# Patient Record
Sex: Female | Born: 1965 | Race: Black or African American | Hispanic: No | Smoking: Never smoker
Health system: Southern US, Community
[De-identification: ages and names within clinical notes are randomized; demographics above are authoritative.]

## PROBLEM LIST (undated history)

## (undated) DIAGNOSIS — K219 Gastro-esophageal reflux disease without esophagitis: Secondary | ICD-10-CM

## (undated) DIAGNOSIS — R011 Cardiac murmur, unspecified: Secondary | ICD-10-CM

## (undated) DIAGNOSIS — G4730 Sleep apnea, unspecified: Secondary | ICD-10-CM

## (undated) DIAGNOSIS — H269 Unspecified cataract: Secondary | ICD-10-CM

## (undated) DIAGNOSIS — D649 Anemia, unspecified: Secondary | ICD-10-CM

## (undated) HISTORY — DX: Unspecified cataract: H26.9

## (undated) HISTORY — PX: RETINAL DETACHMENT SURGERY: SHX105

## (undated) HISTORY — DX: Gastro-esophageal reflux disease without esophagitis: K21.9

## (undated) HISTORY — DX: Anemia, unspecified: D64.9

## (undated) HISTORY — DX: Cardiac murmur, unspecified: R01.1

---

## 2005-03-27 ENCOUNTER — Ambulatory Visit: Payer: Self-pay

## 2006-03-05 ENCOUNTER — Ambulatory Visit: Payer: Self-pay

## 2006-10-29 ENCOUNTER — Emergency Department: Payer: Self-pay | Admitting: Unknown Physician Specialty

## 2007-03-25 ENCOUNTER — Ambulatory Visit: Payer: Self-pay | Admitting: Family Medicine

## 2008-05-14 HISTORY — PX: EYE SURGERY: SHX253

## 2008-12-29 ENCOUNTER — Ambulatory Visit: Payer: Self-pay | Admitting: Family Medicine

## 2009-07-29 LAB — HM DEXA SCAN: HM DEXA SCAN: NORMAL

## 2009-09-08 ENCOUNTER — Emergency Department: Payer: Self-pay | Admitting: Emergency Medicine

## 2009-09-19 ENCOUNTER — Ambulatory Visit: Payer: Self-pay | Admitting: Family Medicine

## 2010-01-04 ENCOUNTER — Ambulatory Visit: Payer: Self-pay | Admitting: Family Medicine

## 2011-01-18 ENCOUNTER — Ambulatory Visit: Payer: Self-pay | Admitting: Family Medicine

## 2012-02-27 ENCOUNTER — Ambulatory Visit: Payer: Self-pay

## 2012-07-07 ENCOUNTER — Emergency Department: Payer: Self-pay | Admitting: Emergency Medicine

## 2013-05-18 ENCOUNTER — Ambulatory Visit: Payer: Self-pay

## 2014-03-12 LAB — HM PAP SMEAR: HM Pap smear: NORMAL

## 2014-05-03 LAB — HM MAMMOGRAPHY: HM MAMMO: NORMAL

## 2014-07-21 ENCOUNTER — Ambulatory Visit: Payer: Self-pay

## 2014-07-26 LAB — LIPID PANEL
Cholesterol: 138 mg/dL (ref 0–200)
HDL: 34 mg/dL — AB (ref 35–70)
LDL CALC: 90 mg/dL
Triglycerides: 72 mg/dL (ref 40–160)

## 2014-07-26 LAB — BASIC METABOLIC PANEL: GLUCOSE: 120 mg/dL

## 2014-09-20 LAB — HEMOGLOBIN A1C: Hemoglobin A1C: 6.8

## 2015-01-21 ENCOUNTER — Other Ambulatory Visit: Payer: Self-pay | Admitting: Family Medicine

## 2015-01-21 NOTE — Telephone Encounter (Signed)
Patient requesting refill. 

## 2015-05-10 ENCOUNTER — Other Ambulatory Visit: Payer: Self-pay | Admitting: Family Medicine

## 2015-05-10 ENCOUNTER — Ambulatory Visit (INDEPENDENT_AMBULATORY_CARE_PROVIDER_SITE_OTHER): Payer: Self-pay | Admitting: Family Medicine

## 2015-05-10 ENCOUNTER — Encounter: Payer: Self-pay | Admitting: Family Medicine

## 2015-05-10 VITALS — BP 126/68 | HR 89 | Temp 98.6°F | Resp 18 | Ht 63.0 in | Wt 201.2 lb

## 2015-05-10 DIAGNOSIS — Z01419 Encounter for gynecological examination (general) (routine) without abnormal findings: Secondary | ICD-10-CM

## 2015-05-10 DIAGNOSIS — Z124 Encounter for screening for malignant neoplasm of cervix: Secondary | ICD-10-CM

## 2015-05-10 DIAGNOSIS — Z113 Encounter for screening for infections with a predominantly sexual mode of transmission: Secondary | ICD-10-CM

## 2015-05-10 DIAGNOSIS — Z23 Encounter for immunization: Secondary | ICD-10-CM

## 2015-05-10 DIAGNOSIS — Z1239 Encounter for other screening for malignant neoplasm of breast: Secondary | ICD-10-CM

## 2015-05-10 DIAGNOSIS — K219 Gastro-esophageal reflux disease without esophagitis: Secondary | ICD-10-CM | POA: Insufficient documentation

## 2015-05-10 DIAGNOSIS — Z3040 Encounter for surveillance of contraceptives, unspecified: Secondary | ICD-10-CM

## 2015-05-10 DIAGNOSIS — Z0000 Encounter for general adult medical examination without abnormal findings: Secondary | ICD-10-CM

## 2015-05-10 MED ORDER — ETONOGESTREL-ETHINYL ESTRADIOL 0.12-0.015 MG/24HR VA RING: VAGINAL_RING | VAGINAL | Status: DC

## 2015-05-10 MED ORDER — RANITIDINE HCL 300 MG PO TABS: 300.0000 mg | ORAL_TABLET | Freq: Every day | ORAL | Status: DC

## 2015-05-10 NOTE — Progress Notes (Signed)
Name: Nancy Mcdowell   MRN: [id number]    DOB: 1965-06-23   Date:05/10/2015       Progress Note  Subjective  Chief Complaint  Chief Complaint  Patient presents with  . Annual Exam    HPI  Well Woman: she has family planning medicaid and is unable to afford any extra labs for screening. She had intercourse 2 days ago and used condoms and also uses Nuvaring. LMP: 04/26/2015, cycles are regular. No  Vaginal discharge, no dyspareunia.   Patient Active Problem List   Diagnosis Date Noted  . GERD (gastroesophageal reflux disease) 05/10/2015    History reviewed. No pertinent past surgical history.  Family History  Problem Relation Age of Onset  . Hyperlipidemia Mother   . Hypertension Mother   . Diabetes Mother   . Kidney disease Father     Social History   Social History  . Marital Status: Married    Spouse Name: N/A  . Number of Children: N/A  . Years of Education: N/A   Occupational History  . Not on file.   Social History Main Topics  . Smoking status: Never Smoker   . Smokeless tobacco: Never Used  . Alcohol Use: 0.6 oz/week    0 Standard drinks or equivalent, 1 Glasses of wine per week  . Drug Use: No  . Sexual Activity: Yes   Other Topics Concern  . Not on file   Social History Narrative  . No narrative on file     Current outpatient prescriptions:  .  etonogestrel-ethinyl estradiol (NUVARING) 0.12-0.015 MG/24HR vaginal ring, , Disp: , Rfl:  .  ranitidine (ZANTAC) 300 MG tablet, take 1 tablet by mouth once daily, Disp: 30 tablet, Rfl: 2  No Known Allergies   ROS  Constitutional: Negative for fever or weight change.  Respiratory: Negative for cough and shortness of breath.   Cardiovascular: Negative for chest pain or palpitations.  Gastrointestinal: Negative for abdominal pain, no bowel changes.  Musculoskeletal: Negative for gait problem or joint swelling.  Skin: Negative for rash.  Neurological: Negative for dizziness or headache.  No other  specific complaints in a complete review of systems (except as listed in HPI above).  Objective  Filed Vitals:   05/10/15 0931  BP: 126/68  Pulse: 89  Temp: 98.6 F (37 C)  TempSrc: Oral  Resp: 18  Height: 5\' 3"  (1.6 m)  Weight: 201 lb 3.2 oz (91.264 kg)  SpO2: 98%    Body mass index is 35.65 kg/(m^2).  Physical Exam   Constitutional: Patient appears well-developed and well-nourished. No distress.  HENT: Head: Normocephalic and atraumatic. Ears: B TMs ok, no erythema or effusion; Nose: Nose normal. Mouth/Throat: Oropharynx is clear and moist. No oropharyngeal exudate.  Eyes: Conjunctivae and EOM are normal. Pupils are equal, round, and reactive to light. No scleral icterus.  Neck: Normal range of motion. Neck supple. No JVD present. No thyromegaly present.  Cardiovascular: Normal rate, regular rhythm and normal heart sounds.  No murmur heard. No BLE edema. Pulmonary/Chest: Effort normal and breath sounds normal. No respiratory distress. Abdominal: Soft. Bowel sounds are normal, no distension. There is no tenderness. no masses Breast:lumpy breast, no nipple discharge or rashes, breast tissue on both axillas FEMALE GENITALIA:  External genitalia normal External urethra normal Vaginal vault normal without discharge or lesions Cervix normal without discharge or lesions Bimanual exam normal without masses RECTAL: not done Musculoskeletal: Normal range of motion, no joint effusions. No gross deformities Neurological: he is alert and  oriented to person, place, and time. No cranial nerve deficit. Coordination, balance, strength, speech and gait are normal.  Skin: Skin is warm and dry. No rash noted. No erythema.  Psychiatric: Patient has a normal mood and affect. behavior is normal. Judgment and thought content normal.   PHQ2/9: Depression screen PHQ 2/9 05/10/2015  Decreased Interest 0  Down, Depressed, Hopeless 0  PHQ - 2 Score 0    Fall Risk: Fall Risk  05/10/2015  Falls  in the past year? No    Functional Status Survey: Is the patient deaf or have difficulty hearing?: No Does the patient have difficulty seeing, even when wearing glasses/contacts?: Yes (conjtacts/glasses) Does the patient have difficulty concentrating, remembering, or making decisions?: No Does the patient have difficulty walking or climbing stairs?: No Does the patient have difficulty dressing or bathing?: No Does the patient have difficulty doing errands alone such as visiting a doctor's office or shopping?: No   Assessment & Plan  1. Well woman exam  Discussed importance of 150 minutes of physical activity weekly, eat two servings of fish weekly, eat one serving of tree nuts ( cashews, pistachios, pecans, almonds.Marland Kitchen) every other day, eat 6 servings of fruit/vegetables daily and drink plenty of water and avoid sweet beverages.   2. Needs flu shot  - Flu Vaccine QUAD 36+ mos PF IM (Fluarix & Fluzone Quad PF) -refused  3. Cervical cancer screening  - Pap IG, CT/NG NAA, and HPV (high risk)  4. Routine screening for STI (sexually transmitted infection)  - Pap IG, CT/NG NAA, and HPV (high risk) - HIV antibody - RPR  5. Encounter for surveillance of contraceptives  - etonogestrel-ethinyl estradiol (NUVARING) 0.12-0.015 MG/24HR vaginal ring; Insert monthly  Dispense: 1 each; Refill: 12   6. Breast cancer screening  - MM Digital Screening; Future

## 2015-05-11 LAB — HIV ANTIBODY (ROUTINE TESTING W REFLEX): HIV SCREEN 4TH GENERATION: NONREACTIVE

## 2015-05-11 LAB — RPR: RPR: NONREACTIVE

## 2015-05-13 LAB — PAP IG, CT-NG NAA, HPV HIGH-RISK
HPV, HIGH-RISK: NEGATIVE
PAP SMEAR COMMENT: 0

## 2016-05-22 ENCOUNTER — Ambulatory Visit (INDEPENDENT_AMBULATORY_CARE_PROVIDER_SITE_OTHER): Payer: Medicaid Other | Admitting: Family Medicine

## 2016-05-22 ENCOUNTER — Encounter: Payer: Self-pay | Admitting: Family Medicine

## 2016-05-22 VITALS — BP 116/64 | HR 106 | Temp 98.7°F | Resp 18 | Ht 63.0 in | Wt 199.1 lb

## 2016-05-22 DIAGNOSIS — Z23 Encounter for immunization: Secondary | ICD-10-CM

## 2016-05-22 DIAGNOSIS — Z124 Encounter for screening for malignant neoplasm of cervix: Secondary | ICD-10-CM

## 2016-05-22 DIAGNOSIS — Z3040 Encounter for surveillance of contraceptives, unspecified: Secondary | ICD-10-CM | POA: Diagnosis not present

## 2016-05-22 DIAGNOSIS — Z113 Encounter for screening for infections with a predominantly sexual mode of transmission: Secondary | ICD-10-CM

## 2016-05-22 DIAGNOSIS — Z01419 Encounter for gynecological examination (general) (routine) without abnormal findings: Secondary | ICD-10-CM

## 2016-05-22 DIAGNOSIS — Z0000 Encounter for general adult medical examination without abnormal findings: Secondary | ICD-10-CM

## 2016-05-22 DIAGNOSIS — L83 Acanthosis nigricans: Secondary | ICD-10-CM | POA: Insufficient documentation

## 2016-05-22 DIAGNOSIS — Z1231 Encounter for screening mammogram for malignant neoplasm of breast: Secondary | ICD-10-CM | POA: Diagnosis not present

## 2016-05-22 DIAGNOSIS — Z79899 Other long term (current) drug therapy: Secondary | ICD-10-CM

## 2016-05-22 DIAGNOSIS — Z1239 Encounter for other screening for malignant neoplasm of breast: Secondary | ICD-10-CM

## 2016-05-22 NOTE — Progress Notes (Signed)
Name: Nancy Mcdowell   MRN: [id number]    DOB: Mar 02, 1966   Date:05/22/2016       Progress Note  Subjective  Chief Complaint  Chief Complaint  Patient presents with  . Annual Exam    HPI  Well Woman: patient has been sexually active with a new partner, no dyspareunia, she has lumpy breast, discussed mammogram . She is concerned about pinworm, because she has some itching on her bottom at night - going on for years ( she only has family planning medicaid - advised to do a otc test to check for pinworm  Patient Active Problem List   Diagnosis Date Noted  . GERD (gastroesophageal reflux disease) 05/10/2015    No past surgical history on file.  Family History  Problem Relation Age of Onset  . Hyperlipidemia Mother   . Hypertension Mother   . Diabetes Mother   . Kidney disease Father     Social History   Social History  . Marital status: Married    Spouse name: N/A  . Number of children: N/A  . Years of education: N/A   Occupational History  . Not on file.   Social History Main Topics  . Smoking status: Never Smoker  . Smokeless tobacco: Never Used  . Alcohol use 0.6 oz/week    1 Glasses of wine per week  . Drug use: No  . Sexual activity: Yes    Partners: Male   Other Topics Concern  . Not on file   Social History Narrative  . No narrative on file     Current Outpatient Prescriptions:  .  NUVARING 0.12-0.015 MG/24HR vaginal ring, insert 1 ring vaginally for 3 weeks REMOVE for 1 week and repeat, Disp: 1 each, Rfl: 12 .  ranitidine (ZANTAC) 300 MG tablet, Take 1 tablet (300 mg total) by mouth daily., Disp: 30 tablet, Rfl: 5  No Known Allergies   ROS  Constitutional: Negative for fever or weight change.  Respiratory: Negative for cough and shortness of breath.   Cardiovascular: Negative for chest pain or palpitations.  Gastrointestinal: Negative for abdominal pain, no bowel changes.  Musculoskeletal: Negative for gait problem or joint swelling.  Skin:  Negative for rash.  Neurological: Negative for dizziness or headache. She snores and gets sleepy during the day ( but refuses sleep study ) No other specific complaints in a complete review of systems (except as listed in HPI above).  Objective  Vitals:   05/22/16 1404  BP: 116/64  Pulse: (!) 106  Resp: 18  Temp: 98.7 F (37.1 C)  TempSrc: Oral  SpO2: 97%  Weight: 199 lb 1.6 oz (90.3 kg)  Height: 5\' 3"  (1.6 m)    Body mass index is 35.27 kg/m.  Physical Exam  Constitutional: Patient appears well-developed and obese No distress.  HENT: Head: Normocephalic and atraumatic. Ears: B TMs ok, no erythema or effusion; Nose: Nose normal. Mouth/Throat: Oropharynx is clear and moist. No oropharyngeal exudate.  Eyes: Conjunctivae and EOM are normal. Pupils are equal, round, and reactive to light. No scleral icterus.  Neck: Normal range of motion. Neck supple. No JVD present. No thyromegaly present.  Cardiovascular: Normal rate, regular rhythm and normal heart sounds.  No murmur heard. No BLE edema. Pulmonary/Chest: Effort normal and breath sounds normal. No respiratory distress. Abdominal: Soft. Bowel sounds are normal, no distension. There is no tenderness. no masses Breast:lumpy breast but no masses, no nipple discharge or rashes, breast tissue on both axilla, left worse than right  side FEMALE GENITALIA:  External genitalia normal External urethra normal Vaginal vault normal without discharge or lesions Cervix normal without discharge or lesions Bimanual exam normal without masses RECTAL: not done Musculoskeletal: Normal range of motion, no joint effusions. No gross deformities Neurological: he is alert and oriented to person, place, and time. No cranial nerve deficit. Coordination, balance, strength, speech and gait are normal.  Skin: Skin is warm and dry. Acanthosis nigricans neck . No erythema.  Psychiatric: Patient has a normal mood and affect. behavior is normal. Judgment and  thought content normal.  PHQ2/9: Depression screen [facility] 2/9 05/22/2016 05/10/2015  Decreased Interest 0 0  Down, Depressed, Hopeless 0 0  PHQ - 2 Score 0 0    Fall Risk: Fall Risk  05/22/2016 05/10/2015  Falls in the past year? No No    Functional Status Survey: Is the patient deaf or have difficulty hearing?: No Does the patient have difficulty seeing, even when wearing glasses/contacts?: No Does the patient have difficulty concentrating, remembering, or making decisions?: No Does the patient have difficulty walking or climbing stairs?: No Does the patient have difficulty dressing or bathing?: No Does the patient have difficulty doing errands alone such as visiting a doctor's office or shopping?: No    Assessment & Plan  1. Well woman exam  Discussed importance of 150 minutes of physical activity weekly, eat two servings of fish weekly, eat one serving of tree nuts ( cashews, pistachios, pecans, almonds.Marland Kitchen) every other day, eat 6 servings of fruit/vegetables daily and drink plenty of water and avoid sweet beverages.  - Hemoglobin A1c - Lipid panel  2. Needs flu shot  refused  3. Cervical cancer screening  - Pap IG, CT/NG NAA, and HPV (high risk)  4. Routine screening for STI (sexually transmitted infection)  -HIV  -RPR genprobe  5. Breast cancer screening  - MM Digital Screening; Future  6. Encounter for surveillance of contraceptives, unspecified contraceptive  - HIV antibody - RPR  7. Long-term use of high-risk medication  - COMPLETE METABOLIC PANEL WITH GFR

## 2016-05-22 NOTE — Addendum Note (Signed)
Addended by: Inda Coke on: 05/22/2016 03:18 PM   Modules accepted: Orders

## 2016-05-26 LAB — PAP IG, CT-NG NAA, HPV HIGH-RISK
Chlamydia, Nuc. Acid Amp: NEGATIVE
GONOCOCCUS BY NUCLEIC ACID AMP: NEGATIVE
HPV, HIGH-RISK: POSITIVE — AB
PAP Smear Comment: 0

## 2016-05-28 ENCOUNTER — Encounter: Payer: Self-pay | Admitting: Family Medicine

## 2016-05-28 DIAGNOSIS — Z8742 Personal history of other diseases of the female genital tract: Secondary | ICD-10-CM | POA: Insufficient documentation

## 2016-05-28 DIAGNOSIS — R87618 Other abnormal cytological findings on specimens from cervix uteri: Secondary | ICD-10-CM | POA: Insufficient documentation

## 2016-05-28 DIAGNOSIS — R8789 Other abnormal findings in specimens from female genital organs: Secondary | ICD-10-CM | POA: Insufficient documentation

## 2016-06-13 ENCOUNTER — Other Ambulatory Visit: Payer: Self-pay | Admitting: Family Medicine

## 2016-06-13 DIAGNOSIS — Z3040 Encounter for surveillance of contraceptives, unspecified: Secondary | ICD-10-CM

## 2016-12-01 ENCOUNTER — Emergency Department: Payer: Self-pay

## 2016-12-01 ENCOUNTER — Encounter: Payer: Self-pay | Admitting: Medical Oncology

## 2016-12-01 ENCOUNTER — Emergency Department
Admission: EM | Admit: 2016-12-01 | Discharge: 2016-12-01 | Disposition: A | Discharge status: Home / Self Care | Payer: Self-pay | Attending: Emergency Medicine | Admitting: Emergency Medicine

## 2016-12-01 DIAGNOSIS — R0789 Other chest pain: Secondary | ICD-10-CM | POA: Insufficient documentation

## 2016-12-01 DIAGNOSIS — Z79899 Other long term (current) drug therapy: Secondary | ICD-10-CM | POA: Insufficient documentation

## 2016-12-01 LAB — TROPONIN I
Troponin I: <0.03 ng/mL (ref <0.03)
Troponin I: <0.03 ng/mL (ref <0.03)

## 2016-12-01 LAB — BASIC METABOLIC PANEL
ANION GAP: 7 (ref 5–15)
BUN: 12 mg/dL (ref 6–20)
CO2: 26 mmol/L (ref 22–32)
Calcium: 9.1 mg/dL (ref 8.9–10.3)
Chloride: 105 mmol/L (ref 101–111)
Creatinine, Ser: 0.87 mg/dL (ref 0.44–1.00)
GFR calc Af Amer: >60 mL/min (ref >60)
GFR calc non Af Amer: >60 mL/min (ref >60)
GLUCOSE: 118 mg/dL — AB (ref 65–99)
POTASSIUM: 3.9 mmol/L (ref 3.5–5.1)
Sodium: 138 mmol/L (ref 135–145)

## 2016-12-01 LAB — CBC
HEMATOCRIT: 34.6 % — AB (ref 35.0–47.0)
HEMOGLOBIN: 11.5 g/dL — AB (ref 12.0–16.0)
MCH: 28.1 pg (ref 26.0–34.0)
MCHC: 33.2 g/dL (ref 32.0–36.0)
MCV: 84.6 fL (ref 80.0–100.0)
Platelets: 168 10*3/uL (ref 150–440)
RBC: 4.09 MIL/uL (ref 3.80–5.20)
RDW: 13.5 % (ref 11.5–14.5)
WBC: 6.5 10*3/uL (ref 3.6–11.0)

## 2016-12-01 NOTE — ED Provider Notes (Signed)
-----------------------------------------   3:37 PM on 12/01/2016 -----------------------------------------  Now by Dr. Corky Downs at 3:15 with the intention of following up on her ultrasound. According to Dr. Lennox Grumbles plan, if ultrasound is negative patient is to be discharged. He has prepared her discharge paperwork. Ultrasound is negative, we will discharge.   Schuyler Amor, MD 12/01/16 1537

## 2016-12-01 NOTE — ED Notes (Signed)
Patient transported to Ultrasound 

## 2016-12-01 NOTE — ED Triage Notes (Signed)
Pt reports yesterday she had lots of what she thought was indigestion with burning of her chest and into her throat, tums did not help. Pt reports also that she has been having pain to upper left thigh since Thursday without injury.

## 2016-12-01 NOTE — ED Provider Notes (Signed)
[facility] Emergency Department Provider Note   ____________________________________________    I have reviewed the triage vital signs and the nursing notes.   HISTORY  Chief Complaint Chest Pain     HPI Nancy Mcdowell is a 51 y.o. female who presents with complaints of chest discomfort. Patient reports yesterday at approximately 1:00 PM she felt a burning discomfort in her chest. She took a Tums and a pantoprazole which helped but it still felt "weird". She denies pleurisy. She has no chest pain currently. No shortness of breath. No hemoptysis. She is not on birth control. She does report some intermittent cramping in her left thigh. No injury to the area. No recent exercise. No cough fevers or chills.   Past Medical History:  Diagnosis Date  . GERD (gastroesophageal reflux disease)     Patient Active Problem List   Diagnosis Date Noted  . Pap smear abnormality of cervix/human papillomavirus (HPV) positive 05/28/2016  . Acanthosis nigricans 05/22/2016  . GERD (gastroesophageal reflux disease) 05/10/2015    Past Surgical History:  Procedure Laterality Date  . RETINAL DETACHMENT SURGERY Left 2010, 2011   2    Prior to Admission medications   Medication Sig Start Date End Date Taking? Authorizing Provider  ranitidine (ZANTAC) 300 MG tablet Take 1 tablet (300 mg total) by mouth daily. 05/10/15  Yes Steele Sizer, MD  NUVARING 0.12-0.015 MG/24HR vaginal ring insert 1 ring vaginally for 3 weeks REMOVE for 1 week and repeat Patient not taking: Reported on 12/01/2016 06/13/16   Steele Sizer, MD     Allergies Patient has no known allergies.  Family History  Problem Relation Age of Onset  . Hyperlipidemia Mother   . Hypertension Mother   . Stroke Mother   . Arthritis Mother   . Kidney disease Father   . Heart attack Father   . Cancer Maternal Aunt        Breast Cancer  . Parkinson's disease Maternal Grandmother   . Heart attack  Paternal Grandfather     Social History Social History  Substance Use Topics  . Smoking status: Never Smoker  . Smokeless tobacco: Never Used  . Alcohol use 0.6 oz/week    1 Glasses of wine per week    Review of Systems  Constitutional: No fever/chills Eyes: No visual changes.  ENT: No sore throat. Cardiovascular: As above Respiratory: Denies shortness of breath. No cough Gastrointestinal: No abdominal pain.  No nausea, no vomiting.   Genitourinary: Negative for dysuria. Musculoskeletal: Negative for back pain. Skin: Negative for rash. Neurological: Negative for headaches   ____________________________________________   PHYSICAL EXAM:  VITAL SIGNS: ED Triage Vitals  Enc Vitals Group     BP 12/01/16 0931 (!) 190/105     Pulse Rate 12/01/16 0931 87     Resp 12/01/16 0931 18     Temp 12/01/16 0931 98.8 F (37.1 C)     Temp Source 12/01/16 0931 Oral     SpO2 12/01/16 0931 100 %     Weight 12/01/16 0932 86.2 kg (190 lb)     Height 12/01/16 0932 1.6 m (5\' 3" )     Head Circumference --      Peak Flow --      Pain Score 12/01/16 0931 5     Pain Loc --      Pain Edu? --      Excl. in [place]? --     Constitutional: Alert and oriented. No acute distress. Pleasant and  interactive Eyes: Conjunctivae are normal.   Nose: No congestion/rhinnorhea. Mouth/Throat: Mucous membranes are moist.    Cardiovascular: Normal rate, regular rhythm. Grossly normal heart sounds.  Good peripheral circulation. Respiratory: Normal respiratory effort.  No retractions. Lungs CTAB. Gastrointestinal: Soft and nontender. No distention.  No CVA tenderness. Genitourinary: deferred Musculoskeletal: No lower extremity tenderness nor edema.  Warm and well perfused Neurologic:  Normal speech and language. No gross focal neurologic deficits are appreciated.  Skin:  Skin is warm, dry and intact. No rash noted. Psychiatric: Mood and affect are normal. Speech and behavior are  normal.  ____________________________________________   LABS (all labs ordered are listed, but only abnormal results are displayed)  Labs Reviewed  BASIC METABOLIC PANEL - Abnormal; Notable for the following:       Result Value   Glucose, Bld 118 (*)    All other components within normal limits  CBC - Abnormal; Notable for the following:    Hemoglobin 11.5 (*)    HCT 34.6 (*)    All other components within normal limits  TROPONIN I  TROPONIN I   ____________________________________________  EKG  ED ECG REPORT I, Lavonia Drafts, the attending physician, personally viewed and interpreted this ECG.  Date: 12/01/2016  Rate: 71 Rhythm: normal sinus rhythm QRS Axis: normal Intervals: normal ST/T Wave abnormalities: normal Narrative Interpretation: unremarkable  ____________________________________________  RADIOLOGY  Question will hazy opacity, patient has no symptoms of pneumonia ____________________________________________   PROCEDURES  Procedure(s) performed: No    Critical Care performed: No ____________________________________________   INITIAL IMPRESSION / ASSESSMENT AND PLAN / ED COURSE  Pertinent labs & imaging results that were available during my care of the patient were reviewed by me and considered in my medical decision making (see chart for details).  Patient well-appearing and in no acute distress. EKG is reassuring. Troponin is negative 2. Does not appear consistent with ACS and I doubt PE nor pneumonia given no pleurisy or risk factors nor fevers or chills. She does report some discomfort in left thigh ultrasound ordered. I've asked Dr. Devra Dopp to follow up on ultrasound    ____________________________________________   FINAL CLINICAL IMPRESSION(S) / ED DIAGNOSES  Final diagnoses:  Atypical chest pain      NEW MEDICATIONS STARTED DURING THIS VISIT:  New Prescriptions   No medications on file     Note:  This document was  prepared using Dragon voice recognition software and may include unintentional dictation errors.    Lavonia Drafts, MD 12/01/16 1525

## 2016-12-01 NOTE — ED Notes (Signed)
Pt expressing frustration due to ED wait. Pt informed that [place] takes longer due to limited techs.

## 2016-12-01 NOTE — ED Notes (Signed)
Pt verbalized understanding of discharge instructions. NAD at this time. 

## 2016-12-04 ENCOUNTER — Telehealth: Payer: Self-pay

## 2016-12-04 NOTE — Telephone Encounter (Signed)
Lmov for patient to call back  She was seen in ED on 12/01/16 for CP  Called twice.   She has not been seen in our office before Please advise.

## 2016-12-04 NOTE — Telephone Encounter (Signed)
Left voicemail message to call back if she would like to come in for evaluation of her chest pain. Recent ED visit showed negative troponin x 2.

## 2016-12-05 NOTE — Telephone Encounter (Signed)
Lmov for patient to call back and schedule appointment fu from ED   Will try again at a later time

## 2016-12-05 NOTE — Telephone Encounter (Signed)
Patient states that she has no insurance and is just not able to afford appointment and testing. She reports that she still has some discomfort and that they did tell her she had a hazy area noted on xray to the right area of her lung. She states that she understands that her health is important and she does want to follow up but just has some financial hardships. Let her know that I would check into assistance programs for her and give her a call back. She was appreciative for the call and has no further questions.

## 2016-12-06 NOTE — Telephone Encounter (Signed)
Lmov for patient to call back and schedule appointment fu from ED             Will try again at a later time

## 2016-12-07 NOTE — Telephone Encounter (Signed)
Lmov for patient to call back and schedule appointment fu from ED  Will try again at a later time

## 2016-12-25 ENCOUNTER — Ambulatory Visit: Payer: Medicaid Other | Admitting: Family Medicine

## 2016-12-25 VITALS — BP 124/83 | HR 96 | Temp 98.6°F | Resp 12 | Ht 63.0 in | Wt 201.4 lb

## 2016-12-25 DIAGNOSIS — R918 Other nonspecific abnormal finding of lung field: Secondary | ICD-10-CM | POA: Insufficient documentation

## 2016-12-25 DIAGNOSIS — D649 Anemia, unspecified: Secondary | ICD-10-CM | POA: Insufficient documentation

## 2016-12-25 DIAGNOSIS — M25562 Pain in left knee: Secondary | ICD-10-CM

## 2016-12-25 DIAGNOSIS — R1013 Epigastric pain: Secondary | ICD-10-CM | POA: Insufficient documentation

## 2016-12-25 DIAGNOSIS — K219 Gastro-esophageal reflux disease without esophagitis: Secondary | ICD-10-CM

## 2016-12-25 HISTORY — DX: Anemia, unspecified: D64.9

## 2016-12-25 MED ORDER — OMEPRAZOLE 20 MG PO CPDR: 20.0000 mg | DELAYED_RELEASE_CAPSULE | Freq: Two times a day (BID) | ORAL | 0 refills | Status: DC

## 2016-12-25 MED ORDER — SUCRALFATE 1 GM/10ML PO SUSP: 1.0000 g | Freq: Three times a day (TID) | ORAL | 0 refills | Status: DC

## 2016-12-25 NOTE — Assessment & Plan Note (Addendum)
Order chest xray to r/u pneumonia, mass, LAD, close f/u with lung exam by provider in two weeks

## 2016-12-25 NOTE — Progress Notes (Signed)
BP 124/83   Pulse 96   Temp 98.6 F (37 C)   Resp 12   Ht 5\' 3"  (1.6 m)   Wt 201 lb 6.4 oz (91.4 kg)   LMP  (LMP Unknown)   BMI 35.68 kg/m    Subjective:    Patient ID: Nancy Mcdowell, female    DOB: 02-11-1966, 51 y.o.   MRN: [id number]  HPI: Nancy Mcdowell is a 51 y.o. female  Chief Complaint  Patient presents with  . Gastroesophageal Reflux    Says she normally has reflux but her recent pain has been different than normal and worse  . Knee Pain    Leg pain in upper thigh and knee that is sudden and painful and sore when touched    HPI Patient is here to establish care at [facility]  1.  GERD Pt reports she has GERD since she was 85. She reports chest pain and cold-like symptoms. Reports she went to the ED on 12/01/16 for increased chest pain. Pt reports no antibiotics were prescribed. Pt will take mom's pretonix when symptoms are bad. Reviewed ED visit. Dx was anemia. Pt reports not aware. Reviewed xray, [place], EKG. Pt reports minimal stress. Pt reports feeling the bitter-acid in back of throat.   2. Knee pain Pt reports sharp pain runs through her L leg. Reports she woke up with a sore leg and swelling without apparent cause. Pt reports mother has arthritis.    Depression screen [facility] 2/9 05/22/2016 05/10/2015  Decreased Interest 0 0  Down, Depressed, Hopeless 0 0  PHQ - 2 Score 0 0    Relevant past medical, surgical, family and social history reviewed Past Medical History:  Diagnosis Date  . Anemia 12/25/2016  . Cataract   . GERD (gastroesophageal reflux disease)   . Heart murmur    Said she was told she had one at 36   Past Surgical History:  Procedure Laterality Date  . RETINAL DETACHMENT SURGERY Left 2010, 2011   2   Family History  Problem Relation Age of Onset  . Hyperlipidemia Mother   . Hypertension Mother   . Stroke Mother   . Arthritis Mother   . Kidney disease Father   . Heart attack Father   . Cancer Maternal Aunt        Breast Cancer  .  Parkinson's disease Maternal Grandmother   . Heart attack Paternal Grandfather    Social History   Social History  . Marital status: Married    Spouse name: N/A  . Number of children: N/A  . Years of education: N/A   Occupational History  . Not on file.   Social History Main Topics  . Smoking status: Never Smoker  . Smokeless tobacco: Never Used  . Alcohol use 0.6 - 1.2 oz/week    1 - 2 Glasses of wine per week  . Drug use: No  . Sexual activity: Yes    Partners: Male    Birth control/ protection: Other-see comments     Comment: Nuvaring   Other Topics Concern  . Not on file   Social History Narrative  . No narrative on file    Interim medical history since last visit reviewed. Allergies and medications reviewed  Review of Systems Per HPI unless specifically indicated above     Objective:    BP 124/83   Pulse 96   Temp 98.6 F (37 C)   Resp 12   Ht 5\' 3"  (1.6  m)   Wt 201 lb 6.4 oz (91.4 kg)   LMP  (LMP Unknown)   BMI 35.68 kg/m   Wt Readings from Last 3 Encounters:  12/25/16 201 lb 6.4 oz (91.4 kg)  12/01/16 190 lb (86.2 kg)  05/22/16 199 lb 1.6 oz (90.3 kg)    Physical Exam  Constitutional: She appears well-nourished.  Morbidly obese  Cardiovascular: Normal rate and regular rhythm.   Pulmonary/Chest: Effort normal.  Abdominal: She exhibits no distension. There is no tenderness.  Musculoskeletal: She exhibits no edema.       Left lower leg: She exhibits no tenderness, no swelling, no edema and no deformity.  Crepitus in both knees  Neurological: She is alert.  Psychiatric: Her mood appears not anxious. She does not exhibit a depressed mood.  Abnormal lung exam. (typed by scribe, further information not recorded)     Assessment & Plan:   Begin Carafate and Omeprazole 20mg  BID. F/u in 2 weeks. Recheck CBC. F/u with knee pain. Order f/u chest xray for abnormal exam. Watch knee pain. Reviewed icing and tylenol with pt.   Problem List Items  Addressed This Visit      Digestive   GERD (gastroesophageal reflux disease)    Reviewed GERD-inducing foods and drinks with pt.  Educated pt on blood loss symptoms and to return to ED if experienced.  Start carafate (QID) as well as omeprazole (BID)      Relevant Medications   omeprazole (PRILOSEC) 20 MG capsule     Other   Epigastric pain    Reviewed.      Relevant Orders   CBC (Completed)   Lung field abnormal finding on examination     Order chest xray to r/u pneumonia, mass, LAD, close f/u with lung exam by provider in two weeks      Relevant Orders   DG Chest 2 View   Anemia    Reviewed findings with patient from ER; close f/u after course of carafate and PPI; if anemia persists, she will need referral for EGD       Relevant Orders   CBC (Completed)    Other Visit Diagnoses    Left knee pain, unspecified chronicity    -  Primary   tylenol, ice discussed       Follow up plan: Return in about 2 weeks (around 01/08/2017).  An after-visit summary was printed and given to the patient at [place].  Please see the patient instructions which may contain other information and recommendations beyond what is mentioned above in the assessment and plan.  Meds ordered this encounter  Medications  . DISCONTD: sucralfate (CARAFATE) 1 GM/10ML suspension    Sig: Take 10 mLs (1 g total) by mouth 4 (four) times daily -  with meals and at bedtime.    Dispense:  420 mL    Refill:  0  . omeprazole (PRILOSEC) 20 MG capsule    Sig: Take 1 capsule (20 mg total) by mouth 2 (two) times daily before a meal.    Dispense:  60 capsule    Refill:  0    Orders Placed This Encounter  Procedures  . DG Chest 2 View  . CBC

## 2016-12-25 NOTE — Assessment & Plan Note (Addendum)
Reviewed findings with patient from ER; close f/u after course of carafate and PPI; if anemia persists, she will need referral for EGD

## 2016-12-25 NOTE — Assessment & Plan Note (Signed)
Reviewed

## 2016-12-27 ENCOUNTER — Other Ambulatory Visit: Payer: Self-pay | Admitting: Urology

## 2016-12-27 ENCOUNTER — Other Ambulatory Visit: Payer: Self-pay | Admitting: Adult Health Nurse Practitioner

## 2016-12-27 MED ORDER — SUCRALFATE 1 G PO TABS: 1.0000 g | ORAL_TABLET | Freq: Three times a day (TID) | ORAL | 0 refills | Status: DC

## 2016-12-31 ENCOUNTER — Encounter (INDEPENDENT_AMBULATORY_CARE_PROVIDER_SITE_OTHER): Payer: Self-pay

## 2016-12-31 ENCOUNTER — Ambulatory Visit: Payer: Self-pay | Admitting: Pharmacy Technician

## 2016-12-31 DIAGNOSIS — Z79899 Other long term (current) drug therapy: Secondary | ICD-10-CM

## 2017-01-01 ENCOUNTER — Other Ambulatory Visit: Payer: Medicaid Other

## 2017-01-01 DIAGNOSIS — R1013 Epigastric pain: Secondary | ICD-10-CM

## 2017-01-01 DIAGNOSIS — D649 Anemia, unspecified: Secondary | ICD-10-CM

## 2017-01-01 NOTE — Progress Notes (Signed)
Completed [facility] application and contract.  Patient agreed to all terms of the [facility] contract.   Patient approved to receive medication assistance through 2018, as long as eligibility criteria continues to be met.  Provided patient with community resource material based on her particular needs.    [facility] [facility]

## 2017-01-02 LAB — CBC
Hematocrit: 34.9 % (ref 34.0–46.6)
Hemoglobin: 10.8 g/dL — ABNORMAL LOW (ref 11.1–15.9)
MCH: 27.4 pg (ref 26.6–33.0)
MCHC: 30.9 g/dL — ABNORMAL LOW (ref 31.5–35.7)
MCV: 89 fL (ref 79–97)
PLATELETS: 200 10*3/uL (ref 150–379)
RBC: 3.94 x10E6/uL (ref 3.77–5.28)
RDW: 13.4 % (ref 12.3–15.4)
WBC: 8.7 10*3/uL (ref 3.4–10.8)

## 2017-01-02 NOTE — Assessment & Plan Note (Addendum)
Reviewed GERD-inducing foods and drinks with pt.  Educated pt on blood loss symptoms and to return to ED if experienced.  Start carafate (QID) as well as omeprazole (BID)

## 2017-01-08 ENCOUNTER — Ambulatory Visit: Payer: Medicaid Other | Admitting: Adult Health Nurse Practitioner

## 2017-01-08 VITALS — BP 125/81 | Temp 98.6°F | Ht 63.5 in | Wt 200.0 lb

## 2017-01-08 DIAGNOSIS — D649 Anemia, unspecified: Secondary | ICD-10-CM

## 2017-01-08 DIAGNOSIS — K219 Gastro-esophageal reflux disease without esophagitis: Secondary | ICD-10-CM

## 2017-01-08 DIAGNOSIS — Z8719 Personal history of other diseases of the digestive system: Secondary | ICD-10-CM | POA: Insufficient documentation

## 2017-01-08 NOTE — Progress Notes (Signed)
   Subjective:    Patient ID: Nancy Mcdowell, female    DOB: February 12, 1966, 51 y.o.   MRN: [id number]  HPI   Pt f/u with L knee pain.  Pt reports L leg is no longer sore.  Pt reports GERD occasionally. She is taking carafate and omeprazole. Pt reports burning during BM and blood at toilet paper a week ago. Denies seeing blood in toilet and unsure about black stools.  Pt endorses anemia during pregnancy and being on iron.  No Menses since March. Pt reports colonoscopy 10 yrs ago. Nothing was found.   Pt denies abdominal pain or vomiting blood.   Patient Active Problem List   Diagnosis Date Noted  . Epigastric pain 12/25/2016  . Anemia 12/25/2016  . Lung field abnormal finding on examination 12/25/2016  . Pap smear abnormality of cervix/human papillomavirus (HPV) positive 05/28/2016  . Acanthosis nigricans 05/22/2016  . GERD (gastroesophageal reflux disease) 05/10/2015   Allergies as of 01/08/2017   No Known Allergies     Medication List       Accurate as of 01/08/17  7:07 PM. Always use your most recent med list.          NUVARING 0.12-0.015 MG/24HR vaginal ring Generic drug:  etonogestrel-ethinyl estradiol insert 1 ring vaginally for 3 weeks REMOVE for 1 week and repeat   omeprazole 20 MG capsule Commonly known as:  PRILOSEC Take 1 capsule (20 mg total) by mouth 2 (two) times daily before a meal.   ranitidine 300 MG tablet Commonly known as:  ZANTAC Take 1 tablet (300 mg total) by mouth daily.   sucralfate 1 g tablet Commonly known as:  CARAFATE Take 1 tablet (1 g total) by mouth 4 (four) times daily -  with meals and at bedtime.        Review of Systems  All other systems reviewed and are negative.      Objective:   Physical Exam  Constitutional: She is oriented to person, place, and time. She appears well-developed and well-nourished.  Cardiovascular: Normal rate, regular rhythm and normal heart sounds.   Pulmonary/Chest: Effort normal and breath sounds  normal.  Abdominal: Soft. Bowel sounds are normal.  Neurological: She is alert and oriented to person, place, and time.  Skin: Skin is warm and dry.    BP 125/81 (BP Location: Left Arm, Patient Position: Sitting, Cuff Size: Normal)   Temp 98.6 F (37 C)   Ht 5' 3.5" (1.613 m)   Wt 200 lb (90.7 kg)   BMI 34.87 kg/m        Assessment & Plan:   Pending chest xray.  Referral to GI to evaluate for need of colonoscopy. Discussed bleeding precautions and FU.  F/u in 6 weeks.

## 2017-01-09 ENCOUNTER — Ambulatory Visit: Payer: Medicaid Other | Admitting: Ophthalmology

## 2017-01-23 ENCOUNTER — Other Ambulatory Visit
Admission: RE | Admit: 2017-01-23 | Discharge: 2017-01-23 | Disposition: A | Discharge status: Home / Self Care | Payer: Self-pay | Source: Ambulatory Visit | Attending: Gastroenterology | Admitting: Gastroenterology

## 2017-01-23 ENCOUNTER — Encounter: Payer: Self-pay | Admitting: Gastroenterology

## 2017-01-23 ENCOUNTER — Other Ambulatory Visit: Payer: Self-pay

## 2017-01-23 ENCOUNTER — Ambulatory Visit (INDEPENDENT_AMBULATORY_CARE_PROVIDER_SITE_OTHER): Payer: Self-pay | Admitting: Gastroenterology

## 2017-01-23 VITALS — BP 117/76 | HR 67 | Temp 98.5°F | Ht 63.5 in | Wt 202.4 lb

## 2017-01-23 DIAGNOSIS — D649 Anemia, unspecified: Secondary | ICD-10-CM

## 2017-01-23 DIAGNOSIS — K625 Hemorrhage of anus and rectum: Secondary | ICD-10-CM

## 2017-01-23 LAB — VITAMIN B12: Vitamin B-12: 219 pg/mL (ref 180–914)

## 2017-01-23 LAB — IRON AND TIBC
IRON: 44 ug/dL (ref 28–170)
Saturation Ratios: 14 % (ref 10.4–31.8)
TIBC: 326 ug/dL (ref 250–450)
UIBC: 283 ug/dL

## 2017-01-23 LAB — FOLATE: Folate: 8.3 ng/mL (ref >5.9)

## 2017-01-23 LAB — FERRITIN: FERRITIN: 129 ng/mL (ref 11–307)

## 2017-01-23 NOTE — Progress Notes (Signed)
[name] MD, MRCP(U.K) [address]  [place]  [place], [place] [address]  Main: [phone]  Fax: [phone]   Gastroenterology Consultation  Referring Provider:     Staci Acosta, NP Primary Care Physician:  Steele Sizer, MD Primary Gastroenterologist:  Dr. [name]  Reason for Consultation:     Rectal bleeding         HPI:   Nancy Mcdowell is a 51 y.o. y/o female referred for consultation & management  by Dr. Ancil Boozer, Drue Stager, MD.   She says she is here today to see me for rectal bleeding. She previously had some diarrhea previously that has resolved.  Rectal bleeding :  Onset and where was blood seen  :for many years on and off, sees it when she wipes on the paper  Frequency of bowel movements : has one every day  Consistency : sometimes it is hard  Change in shape of stool:no  Pain associated with bowel movements:yes  Blood thinner usage:no  NSAID's: motrin or alleve occasionally  Prior colonoscopy :10 years - was performed for unclear reasons  Family history of colon cancer or polyps:no  Weight loss:no    Anemia- says new diagnosis for her. Has not had her periods since 07/2016 . Denies any other over blood loss.   Past Medical History:  Diagnosis Date  . Anemia 12/25/2016  . Cataract   . GERD (gastroesophageal reflux disease)   . Heart murmur    Said she was told she had one at 28    Past Surgical History:  Procedure Laterality Date  . RETINAL DETACHMENT SURGERY Left 2010, 2011   2    Prior to Admission medications   Medication Sig Start Date End Date Taking? Authorizing Provider  NUVARING 0.12-0.015 MG/24HR vaginal ring insert 1 ring vaginally for 3 weeks REMOVE for 1 week and repeat 06/13/16  Yes Sowles, Drue Stager, MD  omeprazole (PRILOSEC) 20 MG capsule Take 1 capsule (20 mg total) by mouth 2 (two) times daily before a meal. 12/25/16  Yes Lada, Satira Anis, MD  sucralfate (CARAFATE) 1 g tablet Take 1 tablet (1 g total) by mouth 4  (four) times daily -  with meals and at bedtime. 12/27/16  Yes Doles-Johnson, Teah, NP    Family History  Problem Relation Age of Onset  . Hyperlipidemia Mother   . Hypertension Mother   . Stroke Mother   . Arthritis Mother   . Kidney disease Father   . Heart attack Father   . Cancer Maternal Aunt        Breast Cancer  . Parkinson's disease Maternal Grandmother   . Heart attack Paternal Grandfather      Social History  Substance Use Topics  . Smoking status: Never Smoker  . Smokeless tobacco: Never Used  . Alcohol use No    Allergies as of 01/23/2017  . (No Known Allergies)    Review of Systems:    All systems reviewed and negative except where noted in HPI.   Physical Exam:  BP 117/76   Pulse 67   Temp 98.5 F (36.9 C) (Oral)   Ht 5' 3.5" (1.613 m)   Wt 202 lb 6.4 oz (91.8 kg)   BMI 35.29 kg/m  No LMP recorded. Patient is perimenopausal. Psych:  Alert and cooperative. Normal mood and affect. General:   Alert,  Well-developed, well-nourished, pleasant and cooperative in NAD Head:  Normocephalic and atraumatic. Eyes:  Sclera clear, no icterus.   Conjunctiva pink. Ears:  Normal auditory acuity. Nose:  No deformity, discharge, or lesions. Mouth:  No deformity or lesions,oropharynx pink & moist. Neck:  Supple; no masses or thyromegaly. Lungs:  Respirations even and unlabored.  Clear throughout to auscultation.   No wheezes, crackles, or rhonchi. No acute distress. Heart:  Regular rate and rhythm; no murmurs, clicks, rubs, or gallops. Abdomen:  Normal bowel sounds.  No bruits.  Soft, non-tender and non-distended without masses, hepatosplenomegaly or hernias noted.  No guarding or rebound tenderness.    Neurologic:  Alert and oriented x3;  grossly normal neurologically. Skin:  Intact without significant lesions or rashes. No jaundice. Lymph Nodes:  No significant cervical adenopathy. Psych:  Alert and cooperative. Normal mood and affect.  Imaging Studies: No results  found.  Assessment and Plan:   Nancy Mcdowell is a 51 y.o. y/o female has been referred for rectal bleeding . She also has a normocytic anemia.   Plan  1. Diagnostic colonoscopy and if she has iron deficiency then will need an EGD as well  2 Iron studies, b12 ,folate.    I have discussed alternative options, risks & benefits,  which include, but are not limited to, bleeding, infection, perforation,respiratory complication & drug reaction.  The patient agrees with this plan & written consent will be obtained.    Follow up in 8-12 weeks.   Dr [name] MD,MRCP(U.K)

## 2017-01-24 ENCOUNTER — Other Ambulatory Visit: Payer: Self-pay

## 2017-01-24 DIAGNOSIS — K625 Hemorrhage of anus and rectum: Secondary | ICD-10-CM

## 2017-01-29 ENCOUNTER — Telehealth: Payer: Self-pay

## 2017-01-29 DIAGNOSIS — E538 Deficiency of other specified B group vitamins: Secondary | ICD-10-CM

## 2017-01-29 NOTE — Telephone Encounter (Signed)
LVM for patient callback.  Per Dr. Vicente Males, pt is borderline b12 deficient:  Additional labs to be drawn for confirmation: MMA (fasting) and homocysteine levels

## 2017-01-29 NOTE — Telephone Encounter (Signed)
-----   Message from [name], MD sent at 01/27/2017  1:00 PM EDT ----- Inform   1. b12 borderline - check MMA and homocysteine levels 2. Folate ,iron normal;

## 2017-02-01 ENCOUNTER — Other Ambulatory Visit
Admission: RE | Admit: 2017-02-01 | Discharge: 2017-02-01 | Disposition: A | Discharge status: Home / Self Care | Payer: Self-pay | Source: Ambulatory Visit | Attending: Gastroenterology | Admitting: Gastroenterology

## 2017-02-01 ENCOUNTER — Encounter: Payer: Self-pay | Admitting: *Deleted

## 2017-02-01 DIAGNOSIS — E538 Deficiency of other specified B group vitamins: Secondary | ICD-10-CM | POA: Insufficient documentation

## 2017-02-04 ENCOUNTER — Ambulatory Visit: Payer: Self-pay | Admitting: Anesthesiology

## 2017-02-04 ENCOUNTER — Ambulatory Visit
Admission: RE | Admit: 2017-02-04 | Discharge: 2017-02-04 | Disposition: A | Discharge status: Home / Self Care | Payer: Self-pay | Source: Ambulatory Visit | Attending: Gastroenterology | Admitting: Gastroenterology

## 2017-02-04 ENCOUNTER — Encounter: Payer: Self-pay | Admitting: *Deleted

## 2017-02-04 ENCOUNTER — Encounter
Admission: RE | Disposition: A | Discharge status: Home / Self Care | Payer: Self-pay | Source: Ambulatory Visit | Attending: Gastroenterology

## 2017-02-04 DIAGNOSIS — D123 Benign neoplasm of transverse colon: Secondary | ICD-10-CM | POA: Insufficient documentation

## 2017-02-04 DIAGNOSIS — K625 Hemorrhage of anus and rectum: Secondary | ICD-10-CM | POA: Insufficient documentation

## 2017-02-04 DIAGNOSIS — D124 Benign neoplasm of descending colon: Secondary | ICD-10-CM | POA: Insufficient documentation

## 2017-02-04 DIAGNOSIS — K219 Gastro-esophageal reflux disease without esophagitis: Secondary | ICD-10-CM | POA: Insufficient documentation

## 2017-02-04 DIAGNOSIS — K640 First degree hemorrhoids: Secondary | ICD-10-CM | POA: Insufficient documentation

## 2017-02-04 DIAGNOSIS — Z79899 Other long term (current) drug therapy: Secondary | ICD-10-CM | POA: Insufficient documentation

## 2017-02-04 DIAGNOSIS — D122 Benign neoplasm of ascending colon: Secondary | ICD-10-CM | POA: Insufficient documentation

## 2017-02-04 HISTORY — PX: COLONOSCOPY WITH PROPOFOL: SHX5780

## 2017-02-04 LAB — POCT PREGNANCY, URINE: Preg Test, Ur: NEGATIVE

## 2017-02-04 LAB — HOMOCYSTEINE: HOMOCYSTEINE-NORM: 13.4 umol/L (ref 0.0–15.0)

## 2017-02-04 SURGERY — COLONOSCOPY WITH PROPOFOL
Anesthesia: General

## 2017-02-04 MED ORDER — PROPOFOL 10 MG/ML IV BOLUS
INTRAVENOUS | Status: DC | PRN
  Administered 2017-02-04: 90 mg via INTRAVENOUS

## 2017-02-04 MED ORDER — PROPOFOL 500 MG/50ML IV EMUL
INTRAVENOUS | Status: DC | PRN
  Administered 2017-02-04: 120 ug/kg/min via INTRAVENOUS

## 2017-02-04 MED ORDER — SODIUM CHLORIDE 0.9 % IV SOLN
INTRAVENOUS | Status: DC
  Administered 2017-02-04: 10:00:00 via INTRAVENOUS

## 2017-02-04 NOTE — Anesthesia Post-op Follow-up Note (Signed)
Anesthesia QCDR form completed.        

## 2017-02-04 NOTE — Anesthesia Preprocedure Evaluation (Signed)
Anesthesia Evaluation  Patient identified by MRN, date of birth, ID band Patient awake    Reviewed: Allergy & Precautions, H&P , NPO status , Patient's Chart, lab work & pertinent test results, reviewed documented beta blocker date and time   Airway Mallampati: II   Neck ROM: full    Dental  (+) Poor Dentition   Pulmonary neg pulmonary ROS,    Pulmonary exam normal        Cardiovascular Exercise Tolerance: Good negative cardio ROS Normal cardiovascular exam+ Valvular Problems/Murmurs  Rhythm:regular Rate:Normal     Neuro/Psych negative neurological ROS  negative psych ROS   GI/Hepatic negative GI ROS, Neg liver ROS, GERD  Medicated,  Endo/Other  negative endocrine ROS  Renal/GU negative Renal ROS  negative genitourinary   Musculoskeletal   Abdominal   Peds  Hematology negative hematology ROS (+) anemia ,   Anesthesia Other Findings Past Medical History: 12/25/2016: Anemia No date: Cataract No date: GERD (gastroesophageal reflux disease) No date: Heart murmur     Comment:  Michela Pitcher she was told she had one at 70 Past Surgical History: 2010, 2011: [place]; Left     Comment:  2   Reproductive/Obstetrics negative OB ROS                             Anesthesia Physical Anesthesia Plan  ASA: III  Anesthesia Plan: General   Post-op Pain Management:    Induction:   PONV Risk Score and Plan:   Airway Management Planned:   Additional Equipment:   Intra-op Plan:   Post-operative Plan:   Informed Consent: I have reviewed the patients History and Physical, chart, labs and discussed the procedure including the risks, benefits and alternatives for the proposed anesthesia with the patient or authorized representative who has indicated his/her understanding and acceptance.   Dental Advisory Given  Plan Discussed with: CRNA  Anesthesia Plan Comments:          Anesthesia Quick Evaluation

## 2017-02-04 NOTE — Transfer of Care (Signed)
Immediate Anesthesia Transfer of Care Note  Patient: Nancy Mcdowell  Procedure(s) Performed: Procedure(s): COLONOSCOPY WITH PROPOFOL (N/A)  Patient Location: Endoscopy Unit  Anesthesia Type:General  Level of Consciousness: drowsy and patient cooperative  Airway & Oxygen Therapy: Patient Spontanous Breathing and Patient connected to nasal cannula oxygen  Post-op Assessment: Report given to RN and Post -op Vital signs reviewed and stable  Post vital signs: Reviewed and stable  Last Vitals:  Vitals:   02/04/17 0954 02/04/17 1122  BP: 124/75 (!) 105/56  Pulse: 76 77  Resp: 16 14  Temp: 36.8 C (!) 35.9 C  SpO2: 100% 99%    Last Pain:  Vitals:   02/04/17 1122  TempSrc: Tympanic         Complications: No apparent anesthesia complications

## 2017-02-04 NOTE — Anesthesia Postprocedure Evaluation (Signed)
Anesthesia Post Note  Patient: Nancy Mcdowell  Procedure(s) Performed: Procedure(s) (LRB): COLONOSCOPY WITH PROPOFOL (N/A)  Patient location during evaluation: PACU Anesthesia Type: General Level of consciousness: awake and alert Pain management: pain level controlled Vital Signs Assessment: post-procedure vital signs reviewed and stable Respiratory status: spontaneous breathing, nonlabored ventilation, respiratory function stable and patient connected to nasal cannula oxygen Cardiovascular status: blood pressure returned to baseline and stable Postop Assessment: no apparent nausea or vomiting Anesthetic complications: no     Last Vitals:  Vitals:   02/04/17 1152 02/04/17 1202  BP: 127/90 (!) 134/92  Pulse: 67 (!) 57  Resp: (!) 21 13  Temp:    SpO2: 100% 100%    Last Pain:  Vitals:   02/04/17 1122  TempSrc: Tympanic                 [name]

## 2017-02-04 NOTE — H&P (Signed)
  Nancy Bellows MD [address], [place], [place] [address] Phone: [phone] Fax : [phone]  Primary Care Physician:  Steele Sizer, MD Primary Gastroenterologist:  Dr. Jonathon Mcdowell   Pre-Procedure History & Physical: HPI:  Nancy Mcdowell is a 51 y.o. female is here for an colonoscopy.   Past Medical History:  Diagnosis Date  . Anemia 12/25/2016  . Cataract   . GERD (gastroesophageal reflux disease)   . Heart murmur    Said she was told she had one at 33    Past Surgical History:  Procedure Laterality Date  . RETINAL DETACHMENT SURGERY Left 2010, 2011   2    Prior to Admission medications   Medication Sig Start Date End Date Taking? Authorizing Provider  NUVARING 0.12-0.015 MG/24HR vaginal ring insert 1 ring vaginally for 3 weeks REMOVE for 1 week and repeat 06/13/16  Yes Sowles, Drue Stager, MD  omeprazole (PRILOSEC) 20 MG capsule Take 1 capsule (20 mg total) by mouth 2 (two) times daily before a meal. 12/25/16  Yes Lada, Satira Anis, MD  sucralfate (CARAFATE) 1 g tablet Take 1 tablet (1 g total) by mouth 4 (four) times daily -  with meals and at bedtime. 12/27/16  Yes Doles-Johnson, Teah, NP    Allergies as of 01/24/2017  . (No Known Allergies)    Family History  Problem Relation Age of Onset  . Hyperlipidemia Mother   . Hypertension Mother   . Stroke Mother   . Arthritis Mother   . Kidney disease Father   . Heart attack Father   . Cancer Maternal Aunt        Breast Cancer  . Parkinson's disease Maternal Grandmother   . Heart attack Paternal Grandfather     Social History   Social History  . Marital status: Legally Separated    Spouse name: N/A  . Number of children: N/A  . Years of education: N/A   Occupational History  . Not on file.   Social History Main Topics  . Smoking status: Never Smoker  . Smokeless tobacco: Never Used  . Alcohol use No  . Drug use: No  . Sexual activity: Yes    Partners: Male    Birth control/ protection: Other-see  comments     Comment: Nuvaring   Other Topics Concern  . Not on file   Social History Narrative  . No narrative on file    Review of Systems: See HPI, otherwise negative ROS  Physical Exam: BP 124/75   Pulse 76   Temp 98.3 F (36.8 C) (Tympanic)   Resp 16   SpO2 100%  General:   Alert,  pleasant and cooperative in NAD Head:  Normocephalic and atraumatic. Neck:  Supple; no masses or thyromegaly. Lungs:  Clear throughout to auscultation.    Heart:  Regular rate and rhythm. Abdomen:  Soft, nontender and nondistended. Normal bowel sounds, without guarding, and without rebound.   Neurologic:  Alert and  oriented x4;  grossly normal neurologically.  Impression/Plan: Nancy Mcdowell is here for an colonoscopy to be performed for rectal bleeding   Risks, benefits, limitations, and alternatives regarding  colonoscopy have been reviewed with the patient.  Questions have been answered.  All parties agreeable.   Nancy Bellows, MD  02/04/2017, 10:10 AM

## 2017-02-04 NOTE — Op Note (Signed)
[facility] Gastroenterology Patient Name: Nancy Mcdowell Procedure Date: 02/04/2017 10:54 AM MRN: [id number] Account #: [id number] Date of Birth: 17-Aug-1965 Admit Type: Outpatient Age: 51 Room: [facility] ENDO ROOM 4 Gender: Female Note Status: Finalized Procedure:            Colonoscopy Indications:          Rectal bleeding Providers:            [name] MD, MD Referring MD:         Bethena Roys. Sowles, MD (Referring MD) Medicines:            Monitored Anesthesia Care Complications:        No immediate complications. Procedure:            Pre-Anesthesia Assessment:                       - Prior to the procedure, a History and Physical was                        performed, and patient medications, allergies and                        sensitivities were reviewed. The patient's tolerance of                        previous anesthesia was reviewed.                       - The risks and benefits of the procedure and the                        sedation options and risks were discussed with the                        patient. All questions were answered and informed                        consent was obtained.                       - ASA Grade Assessment: II - A patient with mild                        systemic disease.                       After obtaining informed consent, the colonoscope was                        passed under direct vision. Throughout the procedure,                        the patient's blood pressure, pulse, and oxygen                        saturations were monitored continuously. The Olympus                        CF-H180AL colonoscope ( S#: [id number] ) was introduced  through the anus and advanced to the the cecum,                        identified by the appendiceal orifice, IC valve and                        transillumination. The colonoscopy was performed with                        ease. The patient tolerated the procedure  well. The                        quality of the bowel preparation was good. Findings:      Non-bleeding internal hemorrhoids were found during retroflexion. The       hemorrhoids were medium-sized and Grade I (internal hemorrhoids that do       not prolapse).      Two sessile polyps were found in the descending colon and ascending       colon. The polyps were 3 to 4 mm in size. These polyps were removed with       a cold snare. Resection and retrieval were complete.      The exam was otherwise without abnormality on direct and retroflexion       views. Impression:           - Non-bleeding internal hemorrhoids.                       - Two 3 to 4 mm polyps in the descending colon and in                        the ascending colon, removed with a cold snare.                        Resected and retrieved.                       - The examination was otherwise normal on direct and                        retroflexion views. Recommendation:       - Discharge patient to home (with escort).                       - Resume previous diet.                       - Continue present medications.                       - Await pathology results.                       - Repeat colonoscopy for surveillance based on                        pathology results.                       - Return to GI office PRN. Procedure Code(s):    --- Professional ---  45385, Colonoscopy, flexible; with removal of tumor(s),                        polyp(s), or other lesion(s) by snare technique Diagnosis Code(s):    --- Professional ---                       D12.2, Benign neoplasm of ascending colon                       D12.4, Benign neoplasm of descending colon                       K64.0, First degree hemorrhoids                       K62.5, Hemorrhage of anus and rectum CPT copyright 2016 American Medical Association. All rights reserved. The codes documented in this report are preliminary and upon  coder review may  be revised to meet current compliance requirements. [name], MD [name] MD, MD 02/04/2017 11:19:18 AM This report has been signed electronically. Number of Addenda: 0 Note Initiated On: 02/04/2017 10:54 AM Scope Withdrawal Time: 0 hours 14 minutes 1 second  Total Procedure Duration: 0 hours 18 minutes 1 second       [facility]

## 2017-02-05 ENCOUNTER — Encounter: Payer: Self-pay | Admitting: Gastroenterology

## 2017-02-05 LAB — SURGICAL PATHOLOGY

## 2017-02-11 LAB — METHYLMALONIC ACID(MMA), RND URINE
Creatinine(Crt), U: 1.28 g/L (ref 0.30–3.00)
MMA - Normalized: 1.1 umol/mmol cr (ref 0.4–2.5)
Methylmalonic Acid, Ur: 12.6 umol/L (ref 1.6–29.7)

## 2017-02-18 ENCOUNTER — Telehealth: Payer: Self-pay

## 2017-02-18 NOTE — Telephone Encounter (Signed)
LVM for patient callback for results per Dr. Vicente Males.   MMA and homocysteine levels are normal so B12 levels are also normal for her .

## 2017-02-18 NOTE — Telephone Encounter (Signed)
-----   Message from [name], MD sent at 02/17/2017 12:26 PM EDT ----- MMA and homocysteine levels are normal so B12 levels are also normal for her .

## 2017-02-19 ENCOUNTER — Telehealth: Payer: Self-pay

## 2017-02-19 NOTE — Telephone Encounter (Signed)
Advised patient of results per Dr. Anna.  

## 2017-02-21 ENCOUNTER — Ambulatory Visit: Payer: Medicaid Other

## 2017-02-26 ENCOUNTER — Telehealth: Payer: Self-pay | Admitting: Adult Health Nurse Practitioner

## 2017-02-26 NOTE — Telephone Encounter (Signed)
Needs to reschedule appt from 10/11

## 2017-03-12 ENCOUNTER — Ambulatory Visit: Payer: Self-pay | Admitting: Urology

## 2017-03-12 VITALS — BP 123/84 | HR 83 | Wt 200.2 lb

## 2017-03-12 DIAGNOSIS — Z131 Encounter for screening for diabetes mellitus: Secondary | ICD-10-CM

## 2017-03-12 NOTE — Progress Notes (Signed)
  Patient: Nancy Mcdowell Female    DOB: 1966/03/09   51 y.o.   MRN: [id number] Visit Date: 03/12/2017  Today's Provider: Zara Council, PA-C   Chief Complaint  Patient presents with  . Follow-up  . Leg Pain    cramping  . Arm Pain    tingling sensation of the right arm   Subjective:    HPI Upset because her temperature was taken without the cover on the probe. "Now she will be spitting all night!"  On the verge of tears.  Underwent colonoscopy on 02/04/2017 and no malignancies found.  Has hemorrhoids.    No leg pain.  Left > right feet numbness and tingling in the bottoms of the feet.  Feels like she is waking on pins.  Over the last two months, she is experiencing this sensation all day long.   She works 7 to 3 Monday - Friday.  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  She takes them to the bathroom, engages in activities and feeding them.  The floor is tile.  She wears tennis shoes.   The sensation is in her feet even on the weekends.  It is worse in the am.    Right arm started having the same sensation about two months ago.      No Known Allergies Previous Medications   NUVARING 0.12-0.015 MG/24HR VAGINAL RING    insert 1 ring vaginally for 3 weeks REMOVE for 1 week and repeat   OMEPRAZOLE (PRILOSEC) 20 MG CAPSULE    Take 1 capsule (20 mg total) by mouth 2 (two) times daily before a meal.   SUCRALFATE (CARAFATE) 1 G TABLET    Take 1 tablet (1 g total) by mouth 4 (four) times daily -  with meals and at bedtime.    Review of Systems  All other systems reviewed and are negative.   Social History  Substance Use Topics  . Smoking status: Never Smoker  . Smokeless tobacco: Never Used  . Alcohol use No   Objective:   BP 123/84   Pulse 83   Wt 200 lb 3.2 oz (90.8 kg)   BMI 34.91 kg/m   Physical Exam  Musculoskeletal:       Right ankle: She exhibits normal range of motion, no swelling, no ecchymosis, no deformity, no laceration and normal pulse. No lateral  malleolus, no medial malleolus, no AITFL, no CF ligament, no posterior TFL, no head of 5th metatarsal and no proximal fibula tenderness found. Achilles tendon exhibits no pain and no defect.       Left ankle: She exhibits normal range of motion, no swelling, no ecchymosis, no deformity, no laceration and normal pulse. No lateral malleolus, no medial malleolus, no AITFL, no CF ligament, no posterior TFL, no head of 5th metatarsal and no proximal fibula tenderness found. Achilles tendon exhibits no pain and no defect.  Good sensation bilaterally        Assessment & Plan:   1. Bilateral foot numbness  - check Hbg A1c  - Ferrtin, Iron and TIBC, vitamin b12 were normal  - appointment with Dr. Vickki Hearing  2. Right arm numbness  - see above       Zara Council, PA-C   [facility]

## 2017-03-13 LAB — HEMOGLOBIN A1C
Est. average glucose Bld gHb Est-mCnc: 103 mg/dL
Hgb A1c MFr Bld: 5.2 % (ref 4.8–5.6)

## 2017-03-20 ENCOUNTER — Ambulatory Visit: Payer: Self-pay | Admitting: Specialist

## 2017-03-27 ENCOUNTER — Ambulatory Visit: Payer: Self-pay | Admitting: Specialist

## 2017-03-27 DIAGNOSIS — R200 Anesthesia of skin: Secondary | ICD-10-CM

## 2017-03-27 DIAGNOSIS — R202 Paresthesia of skin: Principal | ICD-10-CM

## 2017-03-27 NOTE — Progress Notes (Signed)
   Subjective:    Patient ID: Nancy Mcdowell, female    DOB: 1966-04-29, 51 y.o.   MRN: [id number]  HPI  2-3 month HX of intermitten T/N bilateral feet.  L>R No history of DM or thyroid. Recent HX of backache not associated.  Review of Systems     Objective:   Physical Exam  Normal gait. Feet are warm. DP pulses 2+  Light sensation intact. Sharp sensation decreased left foot compared to right - no dermatomal pattern. Temp normal. Vibratory normal. Proprioception normal.      Assessment & Plan:  Impression doubt neuropathy.  OTC Aleve 2 tabs at night for 2 weeks. Return prn

## 2017-04-29 ENCOUNTER — Telehealth: Payer: Self-pay | Admitting: Gastroenterology

## 2017-04-29 NOTE — Telephone Encounter (Signed)
Please call patient as she is wanting to know when she had labs done if it showed her sugar level?

## 2017-04-29 NOTE — Telephone Encounter (Signed)
Returned patient's call concerning A1C level.   LVM for patient callback for results.

## 2017-04-30 ENCOUNTER — Telehealth: Payer: Self-pay | Admitting: Gastroenterology

## 2017-04-30 NOTE — Telephone Encounter (Signed)
Please call patient with results. She has not activated her my chart.

## 2017-05-01 NOTE — Telephone Encounter (Signed)
LVM for patient callback.   Advised patient to grant permission for phone messages with information to be left.   Patient's A1C: 5.2

## 2017-05-24 ENCOUNTER — Encounter: Payer: Self-pay | Admitting: Family Medicine

## 2017-06-11 ENCOUNTER — Ambulatory Visit: Payer: Self-pay | Admitting: Adult Health Nurse Practitioner

## 2017-06-11 DIAGNOSIS — G629 Polyneuropathy, unspecified: Secondary | ICD-10-CM

## 2017-06-11 MED ORDER — GABAPENTIN 300 MG PO CAPS: 300.0000 mg | ORAL_CAPSULE | Freq: Every day | ORAL | 1 refills | Status: DC

## 2017-06-11 NOTE — Progress Notes (Signed)
   Subjective:    Patient ID: Nancy Mcdowell, female    DOB: 1966/05/07, 52 y.o.   MRN: [id number]  HPI   Nancy Mcdowell is a 52 yo female here for pain in her feet and hands.  She was seen by Dr. Lawerance Cruel who told her to take Aleve but she has no relief. Pain occurs all the time and it feels like "pins in her feet".  Her L ring finger is constantly sore and hurts to bend. Pt reports her L thumb had a similar problem a few years ago but it's improved.    Patient Active Problem List   Diagnosis Date Noted  . History of bloody stools 01/08/2017  . Epigastric pain 12/25/2016  . Anemia 12/25/2016  . Lung field abnormal finding on examination 12/25/2016  . Pap smear abnormality of cervix/human papillomavirus (HPV) positive 05/28/2016  . Acanthosis nigricans 05/22/2016  . GERD (gastroesophageal reflux disease) 05/10/2015   Allergies as of 06/11/2017   No Known Allergies     Medication List        Accurate as of 06/11/17  7:25 PM. Always use your most recent med list.          NUVARING 0.12-0.015 MG/24HR vaginal ring Generic drug:  etonogestrel-ethinyl estradiol insert 1 ring vaginally for 3 weeks REMOVE for 1 week and repeat   omeprazole 20 MG capsule Commonly known as:  PRILOSEC Take 1 capsule (20 mg total) by mouth 2 (two) times daily before a meal.   sucralfate 1 g tablet Commonly known as:  CARAFATE Take 1 tablet (1 g total) by mouth 4 (four) times daily -  with meals and at bedtime.        Review of Systems Pt denies sores on feet.      Objective:   Physical Exam  Constitutional: She appears well-developed and well-nourished.  Vitals reviewed. Exam deferred.   BP 132/79   Pulse 81   Temp 98.3 F (36.8 C)   Wt 203 lb 4.8 oz (92.2 kg)   BMI 35.45 kg/m        Assessment & Plan:   Rx Gabapentin 300mg  daily for pain. F/u in 1 mo for med evaluation.  Discussed chronic NSAID use- she is on a PPI.  Recommended alternating between tylenol.    Labs at  the next OV.

## 2017-06-12 LAB — HM PAP SMEAR: HM Pap smear: NEGATIVE

## 2017-07-09 ENCOUNTER — Ambulatory Visit: Payer: Self-pay | Admitting: Urology

## 2017-07-09 VITALS — BP 130/78 | HR 80 | Wt 205.3 lb

## 2017-07-09 DIAGNOSIS — G629 Polyneuropathy, unspecified: Secondary | ICD-10-CM

## 2017-07-09 MED ORDER — SUCRALFATE 1 G PO TABS: 1.0000 g | ORAL_TABLET | Freq: Three times a day (TID) | ORAL | 3 refills | Status: DC

## 2017-07-09 MED ORDER — GABAPENTIN 300 MG PO CAPS: 300.0000 mg | ORAL_CAPSULE | Freq: Every day | ORAL | 3 refills | Status: DC

## 2017-07-09 MED ORDER — OMEPRAZOLE 20 MG PO CPDR: 20.0000 mg | DELAYED_RELEASE_CAPSULE | Freq: Two times a day (BID) | ORAL | 3 refills | Status: DC

## 2017-07-09 NOTE — Progress Notes (Signed)
   Subjective:    Patient ID: Nancy Mcdowell, female    DOB: 22-Dec-1965, 52 y.o.   MRN: [id number]  HPI   Nancy Mcdowell is a 52 yo female here for f/u of Neuropathy. She was started on Gabapentin on 06/11/17. Pt reports pain in her feet have improved but pain in her arms and hands have remained the same. She reports her L thumb and ring finer hurt to bend, are tender, swell, and she cannot make a fist. Pt reports she can wear a splint at home but not at work because she's constantly using her hands and washing her hands. She reports her R arm doesn't tingle but feels a strange sensation down her arm.     Patient Active Problem List   Diagnosis Date Noted  . Neuropathy 06/11/2017  . History of bloody stools 01/08/2017  . Epigastric pain 12/25/2016  . Anemia 12/25/2016  . Lung field abnormal finding on examination 12/25/2016  . Pap smear abnormality of cervix/human papillomavirus (HPV) positive 05/28/2016  . Acanthosis nigricans 05/22/2016  . GERD (gastroesophageal reflux disease) 05/10/2015   Allergies as of 07/09/2017   No Known Allergies     Medication List        Accurate as of 07/09/17  7:45 PM. Always use your most recent med list.          gabapentin 300 MG capsule Commonly known as:  NEURONTIN Take 1 capsule (300 mg total) by mouth at bedtime.   NUVARING 0.12-0.015 MG/24HR vaginal ring Generic drug:  etonogestrel-ethinyl estradiol insert 1 ring vaginally for 3 weeks REMOVE for 1 week and repeat   omeprazole 20 MG capsule Commonly known as:  PRILOSEC Take 1 capsule (20 mg total) by mouth 2 (two) times daily before a meal.   sucralfate 1 g tablet Commonly known as:  CARAFATE Take 1 tablet (1 g total) by mouth 4 (four) times daily -  with meals and at bedtime.        Review of Systems     Objective:   Physical Exam  Musculoskeletal:       Left hand: She exhibits decreased range of motion and tenderness.    BP 130/78   Pulse 80   Wt 205 lb 4.8 oz (93.1  kg)   BMI 35.80 kg/m        Assessment & Plan:   F/u in 1 mo w/ labs to review results and establish plan for pain in hand and arm.  Refer to Dr. Vickki Hearing for f/u of ortho of L hand and R arm.

## 2017-07-17 ENCOUNTER — Ambulatory Visit: Payer: Self-pay | Admitting: Specialist

## 2017-07-17 DIAGNOSIS — M65342 Trigger finger, left ring finger: Secondary | ICD-10-CM

## 2017-07-17 NOTE — Progress Notes (Signed)
   Subjective:    Patient ID: Nancy Mcdowell, female    DOB: 04-29-1966, 52 y.o.   MRN: [id number]  HPI No improvment with her feet. She is also having multiple difficulties in the UEs. She has trigger finger left ring and left thumb.    Review of Systems    deferred Objective:   Physical Exam   I can reproduce the triggering of the left RF.       Assessment & Plan:  IMP: Multiple arthralgias. I think it's time for her to be evaluated by the rheumatologist.   Plan: refer to [facility] rheumotology consult.

## 2017-07-23 ENCOUNTER — Ambulatory Visit: Payer: Self-pay | Admitting: Licensed Clinical Social Worker

## 2017-08-06 ENCOUNTER — Other Ambulatory Visit: Payer: Self-pay

## 2017-08-06 DIAGNOSIS — G629 Polyneuropathy, unspecified: Secondary | ICD-10-CM

## 2017-08-07 LAB — CBC
Hematocrit: 34.1 % (ref 34.0–46.6)
Hemoglobin: 10.9 g/dL — ABNORMAL LOW (ref 11.1–15.9)
MCH: 27.5 pg (ref 26.6–33.0)
MCHC: 32 g/dL (ref 31.5–35.7)
MCV: 86 fL (ref 79–97)
PLATELETS: 198 10*3/uL (ref 150–379)
RBC: 3.96 x10E6/uL (ref 3.77–5.28)
RDW: 13.4 % (ref 12.3–15.4)
WBC: 7.5 10*3/uL (ref 3.4–10.8)

## 2017-08-07 LAB — C-REACTIVE PROTEIN: CRP: 4.5 mg/L (ref 0.0–4.9)

## 2017-08-07 LAB — SEDIMENTATION RATE: Sed Rate: 65 mm/hr — ABNORMAL HIGH (ref 0–40)

## 2017-08-08 LAB — CYCLIC CITRUL PEPTIDE ANTIBODY, IGG/IGA: Cyclic Citrullin Peptide Ab: 10 units (ref 0–19)

## 2017-08-13 ENCOUNTER — Ambulatory Visit: Payer: Self-pay | Admitting: Urology

## 2017-08-13 VITALS — BP 131/82 | HR 82 | Temp 97.7°F | Wt 206.6 lb

## 2017-08-13 DIAGNOSIS — G629 Polyneuropathy, unspecified: Secondary | ICD-10-CM

## 2017-08-13 MED ORDER — SULFAMETHOXAZOLE-TRIMETHOPRIM 800-160 MG PO TABS: 1.0000 | ORAL_TABLET | Freq: Two times a day (BID) | ORAL | 0 refills | Status: DC

## 2017-08-13 NOTE — Progress Notes (Signed)
  Patient: Nancy Mcdowell Female    DOB: 1965-10-25   52 y.o.   MRN: [id number] Visit Date: 08/13/2017  Today's Provider: [place]   Chief Complaint  Patient presents with  . Follow-up    lab results   Subjective:    HPI Gabapentin is helpful in controlling on feet pain, but does not want to be dependent on medications.  Describes the pain as walking on pins.  Advil, Aleve and Tylenol help somewhat.   Told at a work physical that she had an UTI - she has a foul odor and dysuria    No Known Allergies Previous Medications   GABAPENTIN (NEURONTIN) 300 MG CAPSULE    Take 1 capsule (300 mg total) by mouth at bedtime.   NUVARING 0.12-0.015 MG/24HR VAGINAL RING    insert 1 ring vaginally for 3 weeks REMOVE for 1 week and repeat   OMEPRAZOLE (PRILOSEC) 20 MG CAPSULE    Take 1 capsule (20 mg total) by mouth 2 (two) times daily before a meal.   SUCRALFATE (CARAFATE) 1 G TABLET    Take 1 tablet (1 g total) by mouth 4 (four) times daily -  with meals and at bedtime.    Review of Systems  Constitutional: Negative.   HENT: Negative.   Eyes: Negative.   Respiratory: Negative.   Cardiovascular: Negative.   Gastrointestinal: Negative.   Endocrine: Negative.   Genitourinary: Negative.   Musculoskeletal: Positive for arthralgias and joint swelling.  Skin: Negative.   Neurological: Positive for numbness.  Hematological: Negative.   Psychiatric/Behavioral: Negative.     Social History   Tobacco Use  . Smoking status: Never Smoker  . Smokeless tobacco: Never Used  Substance Use Topics  . Alcohol use: No    Alcohol/week: 0.6 - 1.2 oz    Types: 1 - 2 Glasses of wine per week   Objective:   BP 131/82 (BP Location: Left Arm, Patient Position: Sitting, Cuff Size: Large)   Pulse 82   Temp 97.7 F (36.5 C)   Wt 206 lb 9.6 oz (93.7 kg)   BMI 36.02 kg/m   Physical Exam  Constitutional: She is oriented to person, place, and time. She appears well-developed and  well-nourished.  HENT:  Head: Normocephalic and atraumatic.  Right Ear: External ear normal.  Left Ear: External ear normal.  Nose: Nose normal.  Mouth/Throat: Oropharynx is clear and moist.  Eyes: Pupils are equal, round, and reactive to light. Conjunctivae and EOM are normal.  Neck: Normal range of motion. Neck supple.  Cardiovascular: Normal rate, regular rhythm, normal heart sounds and intact distal pulses.  Pulmonary/Chest: Effort normal and breath sounds normal.  Musculoskeletal: Normal range of motion.  Neurological: She is alert and oriented to person, place, and time. She has normal reflexes.  Skin: Skin is warm and dry.  Psychiatric: She has a normal mood and affect. Her behavior is normal. Judgment and thought content normal.        Assessment & Plan:   1. Peripheral neuropathy Continue Gabapentin  Recommend capsaicin cream  Rheumatology referral pending  2. UTI Sent in script for Septra DS  3. Anemia Will check LFT's and renal function   4. Multiple arthralgias Refer to [facility] rheumotology  Follow up in one month for labs         [facility]

## 2017-08-13 NOTE — Patient Instructions (Signed)
Arthricare For Women Capsagel Capsagesic-HP Arthritis Relief Capsin Double Cap Icy Hot Arthritis Therapy Pain Enz Rid-A-Pain Sportsmed Therapatch Warm Trixaicin Zostrix

## 2017-08-14 ENCOUNTER — Telehealth: Payer: Self-pay

## 2017-08-14 LAB — COMPREHENSIVE METABOLIC PANEL
ALBUMIN: 4.5 g/dL (ref 3.5–5.5)
ALT: 15 IU/L (ref 0–32)
AST: 15 IU/L (ref 0–40)
Albumin/Globulin Ratio: 1.3 (ref 1.2–2.2)
Alkaline Phosphatase: 114 IU/L (ref 39–117)
BUN / CREAT RATIO: 14 (ref 9–23)
BUN: 14 mg/dL (ref 6–24)
CALCIUM: 9.2 mg/dL (ref 8.7–10.2)
CHLORIDE: 105 mmol/L (ref 96–106)
CO2: 21 mmol/L (ref 20–29)
Creatinine, Ser: 0.98 mg/dL (ref 0.57–1.00)
GFR, EST AFRICAN AMERICAN: 78 mL/min/{1.73_m2} (ref >59)
GFR, EST NON AFRICAN AMERICAN: 67 mL/min/{1.73_m2} (ref >59)
GLUCOSE: 109 mg/dL — AB (ref 65–99)
Globulin, Total: 3.5 g/dL (ref 1.5–4.5)
Potassium: 4.3 mmol/L (ref 3.5–5.2)
Sodium: 144 mmol/L (ref 134–144)
TOTAL PROTEIN: 8 g/dL (ref 6.0–8.5)

## 2017-08-14 NOTE — Telephone Encounter (Signed)
Faxed referral to [facility] rheumatology

## 2017-09-10 ENCOUNTER — Ambulatory Visit: Payer: Self-pay | Admitting: Family Medicine

## 2017-09-10 VITALS — BP 129/81 | HR 84 | Temp 98.8°F | Wt 205.4 lb

## 2017-09-10 DIAGNOSIS — D649 Anemia, unspecified: Secondary | ICD-10-CM

## 2017-09-10 DIAGNOSIS — K219 Gastro-esophageal reflux disease without esophagitis: Secondary | ICD-10-CM

## 2017-09-10 DIAGNOSIS — Z9289 Personal history of other medical treatment: Secondary | ICD-10-CM

## 2017-09-10 DIAGNOSIS — Z09 Encounter for follow-up examination after completed treatment for conditions other than malignant neoplasm: Secondary | ICD-10-CM

## 2017-09-10 DIAGNOSIS — G629 Polyneuropathy, unspecified: Secondary | ICD-10-CM

## 2017-09-10 NOTE — Progress Notes (Signed)
Patient: Nancy Mcdowell Female    DOB: July 09, 1965   52 y.o.   MRN: [id number] Visit Date: 09/10/2017  Today's Provider: Azzie Glatter, FNP   Chief Complaint  Patient presents with  . Pain   Subjective:    HPI   States that she has had numbness/tingling/pain in her her fingers, toes, and feet since last July. States that neuropathy began July 2018. She states she has appointment with Rheumatologist 01/2018, as referred by Dr. Adline Peals. She states that she is taking Gabapentin 300 mg QHS as directed. States that pain has remained the same.   She had recent UTI, which she has completed antibiotics, and she states that infection has resolved. She does have hot flashes. She denies fevers, chills, fatigue, and intentional weight loss.   She states that she recently had Colonoscopy within the last year for hemorrhoids. She has a good appetite. She states that her bowel movements are normal. She denies abdominal pain, nausea, vomiting, diarrhea, and constipation. She denies any bleeding episodes.   States that her last Mammogram was a couple of years ago. Denies breast changes, pain, discharge, and lymph nodes.   Denies cough, shortness of breath, heart palpitations, and chest pain. Denies dizziness, headaches, and falls. Denies anxiety and depression. She denies pain today.    No Known Allergies  Allergies as of 09/10/2017   No Known Allergies     Medication List        Accurate as of 09/10/17 10:36 PM. Always use your most recent med list.          gabapentin 300 MG capsule Commonly known as:  NEURONTIN Take 1 capsule (300 mg total) by mouth at bedtime.   Iron 325 (65 Fe) MG Tabs Take 325 mg by mouth.   NUVARING 0.12-0.015 MG/24HR vaginal ring Generic drug:  etonogestrel-ethinyl estradiol insert 1 ring vaginally for 3 weeks REMOVE for 1 week and repeat   omeprazole 20 MG capsule Commonly known as:  PRILOSEC Take 1 capsule (20 mg total) by mouth 2 (two) times  daily before a meal.   sucralfate 1 g tablet Commonly known as:  CARAFATE Take 1 tablet (1 g total) by mouth 4 (four) times daily -  with meals and at bedtime.   sulfamethoxazole-trimethoprim 800-160 MG tablet Commonly known as:  BACTRIM DS,SEPTRA DS Take 1 tablet by mouth every 12 (twelve) hours.       Review of Systems  Constitutional: Negative.   HENT: Negative.   Eyes: Negative.   Respiratory: Negative.   Gastrointestinal: Negative.   Endocrine: Negative.   Genitourinary: Negative.   Musculoskeletal: Positive for arthralgias and joint swelling.       Numbness/Tingling/Pain in fingers, toes, and feet.   Mild ankle swelling.   Skin: Negative.   Allergic/Immunologic: Negative.   Neurological: Positive for dizziness.       Occasional  Hematological: Negative.   Psychiatric/Behavioral: Negative.     Social History   Tobacco Use  . Smoking status: Never Smoker  . Smokeless tobacco: Never Used  Substance Use Topics  . Alcohol use: No    Alcohol/week: 0.6 - 1.2 oz    Types: 1 - 2 Glasses of wine per week   Objective:   BP 129/81   Pulse 84   Temp 98.8 F (37.1 C)   Wt 205 lb 6.4 oz (93.2 kg)   BMI 35.81 kg/m   Physical Exam  Constitutional: She is oriented to person, place, and time. She appears well-developed  and well-nourished.  HENT:  Head: Normocephalic and atraumatic.  Right Ear: External ear normal.  Left Ear: External ear normal.  Nose: Nose normal.  Mouth/Throat: Oropharynx is clear and moist.  Eyes: Pupils are equal, round, and reactive to light. Conjunctivae and EOM are normal.  Neck: Normal range of motion. Neck supple.  Cardiovascular: Normal rate, regular rhythm, normal heart sounds and intact distal pulses.  Abdominal: Soft. Bowel sounds are normal.  Musculoskeletal: Normal range of motion. She exhibits edema.  Mild ankle swelling.  Neurological: She is alert and oriented to person, place, and time.  Skin: Skin is warm and dry. Capillary  refill takes less than 2 seconds.  Psychiatric: She has a normal mood and affect. Her behavior is normal. Judgment and thought content normal.  Nursing note and vitals reviewed.    Assessment & Plan:  1. Neuropathy Stable. Not worsening. She continues Neuropathy 300 mg QHS. She is has referral to Rheumatologist 01/2018. We will follow up with her in 3 months. She will report to office if she has any other concerns.   2. Anemia, unspecified type Stable. Recent Hgb was 10.9 on 08/06/2017. She states that she takes oral iron daily.   3. History of mammogram Diagnostic Mammogram ordered today. Referral to [facility] for Mammogram.   4. Gastroesophageal reflux disease without esophagitis Stable. Last Colonoscopy was on 02/04/2017 to assess bleeding hemorrhoids. 2 polyps were removed from Ascending Colon. She will continue Prilosec as directed.   5. Follow up Follow up in 3 months for Labs/OV.   Azzie Glatter, FNP   [facility]

## 2017-09-30 ENCOUNTER — Encounter: Payer: Self-pay | Admitting: Nurse Practitioner

## 2017-10-30 ENCOUNTER — Ambulatory Visit
Admission: RE | Admit: 2017-10-30 | Discharge: 2017-10-30 | Disposition: A | Discharge status: Home / Self Care | Payer: Self-pay | Source: Ambulatory Visit | Attending: Oncology | Admitting: Oncology

## 2017-10-30 ENCOUNTER — Ambulatory Visit: Payer: Self-pay | Attending: Oncology

## 2017-10-30 VITALS — BP 127/86 | HR 75 | Temp 97.4°F | Resp 18 | Ht 63.0 in | Wt 207.0 lb

## 2017-10-30 DIAGNOSIS — Z0000 Encounter for general adult medical examination without abnormal findings: Secondary | ICD-10-CM

## 2017-10-30 NOTE — Progress Notes (Signed)
  Subjective:     Patient ID: Nancy Mcdowell, female   DOB: [birth year], 52 y.o.   MRN: [id number]  HPI   Review of Systems     Objective:   Physical Exam  Pulmonary/Chest: Right breast exhibits no inverted nipple, no mass, no nipple discharge, no skin change and no tenderness. Left breast exhibits no inverted nipple, no mass, no nipple discharge, no skin change and no tenderness. Breasts are symmetrical.       Assessment:     52 year old patient presents for [facility] clinic visit.  Patient screened, and meets BCCCP eligibility.  Patient does not have insurance, Medicare or Medicaid.  Handout given on Affordable Care Act.  Instructed patient on breast self awareness using teach back method.  Clinical breast exam unremarkable.  No mass or lump palpated.  Patient had a negative/negative pap at [facility]. In January 2019. Results scanned to chart. Next pap due 2022 per BCCCP guidelines.    Plan:     Sent for bilateral screening mammogram.  [employer]esy car called to transport patient.

## 2017-11-01 ENCOUNTER — Telehealth: Payer: Self-pay | Admitting: Pharmacy Technician

## 2017-11-01 NOTE — Telephone Encounter (Signed)
Patient provided poi for 2019.  Patient called Mom and Daughter on taxes.  Daughter has no income.  Still need poi from Mom before determining eligibility.  [facility]

## 2017-11-10 NOTE — Progress Notes (Signed)
Letter mailed from [facility] to notify of normal mammogram results.  Patient to return in one year for annual screening.  Copy to [facility]. 

## 2017-12-03 ENCOUNTER — Other Ambulatory Visit: Payer: Self-pay

## 2017-12-05 ENCOUNTER — Other Ambulatory Visit: Payer: Self-pay

## 2017-12-05 DIAGNOSIS — K219 Gastro-esophageal reflux disease without esophagitis: Secondary | ICD-10-CM

## 2017-12-05 NOTE — Progress Notes (Unsigned)
liver 

## 2017-12-06 LAB — COMPREHENSIVE METABOLIC PANEL
ALK PHOS: 100 IU/L (ref 39–117)
ALT: 18 IU/L (ref 0–32)
AST: 20 IU/L (ref 0–40)
Albumin/Globulin Ratio: 1.2 (ref 1.2–2.2)
Albumin: 4.2 g/dL (ref 3.5–5.5)
BUN/Creatinine Ratio: 12 (ref 9–23)
BUN: 11 mg/dL (ref 6–24)
Bilirubin Total: <0.2 mg/dL (ref 0.0–1.2)
CO2: 25 mmol/L (ref 20–29)
CREATININE: 0.9 mg/dL (ref 0.57–1.00)
Calcium: 9.1 mg/dL (ref 8.7–10.2)
Chloride: 106 mmol/L (ref 96–106)
GFR calc Af Amer: 86 mL/min/{1.73_m2} (ref >59)
GFR calc non Af Amer: 75 mL/min/{1.73_m2} (ref >59)
GLUCOSE: 95 mg/dL (ref 65–99)
Globulin, Total: 3.4 g/dL (ref 1.5–4.5)
Potassium: 4.7 mmol/L (ref 3.5–5.2)
Sodium: 143 mmol/L (ref 134–144)
Total Protein: 7.6 g/dL (ref 6.0–8.5)

## 2017-12-06 LAB — RENAL FUNCTION PANEL: PHOSPHORUS: 4.3 mg/dL (ref 2.5–4.5)

## 2017-12-06 LAB — HEPATIC FUNCTION PANEL: BILIRUBIN, DIRECT: 0.08 mg/dL (ref 0.00–0.40)

## 2017-12-10 ENCOUNTER — Ambulatory Visit: Payer: Self-pay

## 2018-02-11 ENCOUNTER — Telehealth: Payer: Self-pay | Admitting: Pharmacy Technician

## 2018-02-11 NOTE — Telephone Encounter (Signed)
Received updated proof of income.  Patient eligible to receive medication assistance at [facility] as long as eligibility requirements continue to be met.  Nancy Mcdowell Care [facility] 

## 2018-02-27 ENCOUNTER — Ambulatory Visit: Payer: Self-pay | Admitting: Urology

## 2018-02-27 VITALS — BP 132/84 | HR 84 | Temp 99.0°F | Ht 62.5 in | Wt 205.6 lb

## 2018-02-27 DIAGNOSIS — G629 Polyneuropathy, unspecified: Secondary | ICD-10-CM

## 2018-02-27 MED ORDER — GABAPENTIN 300 MG PO CAPS: 300.0000 mg | ORAL_CAPSULE | Freq: Every day | ORAL | 3 refills | Status: DC

## 2018-02-27 NOTE — Progress Notes (Signed)
Patient: Nancy Mcdowell Female    DOB: 07-31-1965   52 y.o.   MRN: [id number] Visit Date: 02/27/2018  Today's Provider: [place]   Chief Complaint  Patient presents with  . Follow-up    med refill   Subjective:    HPI  She states rheumatologist wants her back on her gabapentin.    Background history States that she has had numbness/tingling/pain in her her fingers, toes, and feet since last July. States that neuropathy began July 2018. She states she has appointment with Rheumatologist 01/2018, as referred by Dr. Adline Peals. She states that she is taking Gabapentin 300 mg QHS as directed. States that pain has remained the same.   She states that she recently had Colonoscopy within the last year for hemorrhoids. She has a good appetite. She states that her bowel movements are normal. She denies abdominal pain, nausea, vomiting, diarrhea, and constipation. She denies any bleeding episodes.   States that her last Mammogram was in June 2019.  Marland Kitchen Denies breast changes, pain, discharge, and lymph nodes.   The rheumatologist has her scheduled for a sleep study on 10/23.  They have ruled out RA and connective tissue disorder.  X-ray of hands did not demonstrate any erosive lesions.     No Known Allergies  Allergies as of 02/27/2018   No Known Allergies     Medication List        Accurate as of 02/27/18  7:37 PM. Always use your most recent med list.          gabapentin 300 MG capsule Commonly known as:  NEURONTIN Take 1 capsule (300 mg total) by mouth at bedtime.   Iron 325 (65 Fe) MG Tabs Take 325 mg by mouth.   NUVARING 0.12-0.015 MG/24HR vaginal ring Generic drug:  etonogestrel-ethinyl estradiol insert 1 ring vaginally for 3 weeks REMOVE for 1 week and repeat   omeprazole 20 MG capsule Commonly known as:  PRILOSEC Take 1 capsule (20 mg total) by mouth 2 (two) times daily before a meal.   sucralfate 1 g tablet Commonly known as:  CARAFATE Take 1  tablet (1 g total) by mouth 4 (four) times daily -  with meals and at bedtime.   sulfamethoxazole-trimethoprim 800-160 MG tablet Commonly known as:  BACTRIM DS,SEPTRA DS Take 1 tablet by mouth every 12 (twelve) hours.       Review of Systems  Constitutional: Negative.   HENT: Negative.   Eyes: Negative.   Respiratory: Negative.   Gastrointestinal: Negative.   Endocrine: Negative.   Genitourinary: Negative.   Musculoskeletal: Positive for arthralgias and joint swelling.       Numbness/Tingling/Pain in fingers, toes, and feet.   Mild ankle swelling.   Skin: Negative.   Allergic/Immunologic: Negative.   Neurological: Positive for dizziness.       Occasional  Hematological: Negative.   Psychiatric/Behavioral: Negative.     Social History   Tobacco Use  . Smoking status: Never Smoker  . Smokeless tobacco: Never Used  Substance Use Topics  . Alcohol use: No    Alcohol/week: 1.0 - 2.0 standard drinks    Types: 1 - 2 Glasses of wine per week   Objective:   BP 132/84   Pulse 84   Temp 99 F (37.2 C)   Ht 5' 2.5" (1.588 m)   Wt 205 lb 9.6 oz (93.3 kg)   LMP  (LMP Unknown)   BMI 37.01 kg/m   Physical Exam  Constitutional: She is  oriented to person, place, and time. She appears well-developed and well-nourished.  HENT:  Head: Normocephalic and atraumatic.  Right Ear: External ear normal.  Left Ear: External ear normal.  Eyes: Pupils are equal, round, and reactive to light. Conjunctivae and EOM are normal.  Neck: Normal range of motion.  Musculoskeletal: Normal range of motion.  Neurological: She is alert and oriented to person, place, and time.  Skin: Skin is warm and dry.  Psychiatric: She has a normal mood and affect. Her behavior is normal. Judgment and thought content normal.  Nursing note and vitals reviewed.    Assessment & Plan:  1. Neuropathy Stable. Not worsening. She continues gabapentin 300 mg QHS.  Rheumatologist would like her to take it twice  daily.  She will report to office if she has any other concerns.   2. Anemia, unspecified type Stable. Recent Hgb was 10.9 on 08/06/2017. She states that she takes oral iron daily.   3. History of mammogram Diagnostic Mammogram was negative on 10/30/2017.  Referral to [facility] for Mammogram.   4. Gastroesophageal reflux disease without esophagitis Stable. Last Colonoscopy was on 02/04/2017 to assess bleeding hemorrhoids. 2 polyps were removed from [facility]. She will continue Prilosec as directed.   5. Follow up Follow up in 3 months for Labs/OV.   [facility]

## 2018-02-28 ENCOUNTER — Other Ambulatory Visit: Payer: Self-pay

## 2018-02-28 MED ORDER — OMEPRAZOLE 20 MG PO CPDR: 20.0000 mg | DELAYED_RELEASE_CAPSULE | Freq: Two times a day (BID) | ORAL | 3 refills | Status: DC

## 2018-02-28 MED ORDER — GABAPENTIN 300 MG PO CAPS: 300.0000 mg | ORAL_CAPSULE | Freq: Every day | ORAL | 3 refills | Status: DC

## 2018-02-28 MED ORDER — IRON 325 (65 FE) MG PO TABS: 325.0000 mg | ORAL_TABLET | Freq: Every day | ORAL | 0 refills | Status: DC

## 2018-03-06 LAB — HM HIV SCREENING LAB: HM HIV Screening: NEGATIVE

## 2018-03-26 ENCOUNTER — Other Ambulatory Visit: Payer: Self-pay | Admitting: Internal Medicine

## 2018-06-05 ENCOUNTER — Ambulatory Visit: Payer: Self-pay

## 2018-06-05 ENCOUNTER — Ambulatory Visit: Payer: Self-pay | Admitting: Obstetrics and Gynecology

## 2018-06-05 VITALS — BP 129/81 | HR 72 | Temp 98.0°F | Ht 63.0 in | Wt 199.9 lb

## 2018-06-05 DIAGNOSIS — G629 Polyneuropathy, unspecified: Secondary | ICD-10-CM

## 2018-06-05 DIAGNOSIS — D649 Anemia, unspecified: Secondary | ICD-10-CM

## 2018-06-05 NOTE — Progress Notes (Signed)
OBJECTIVE: Patient: Nancy Mcdowell Female    DOB: 07/05/1965   53 y.o.   MRN: [id number] Visit Date: 06/05/2018  Today's Provider: [place]   Chief Complaint  Patient presents with  . Follow-up   Subjective:    HPI   Background history States that she has had numbness/tingling/pain in her her fingers, toes, and feet since last July. States that neuropathy began July 2018. Rheumatology wants pt on gabapentin 300 mg BID but pt takes QHS because it makes her tired. 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 Pt still has tingling in feet and cramping in LT hand. Usually sleep on LT side. Sx still as intense but not as frequent now. Is due for f/u labs today. Hx of anemia, takes iron daily.   They have ruled out RA and connective tissue disorder.  X-ray of hands did not demonstrate any erosive lesions.     No Known Allergies  Allergies as of 06/05/2018   No Known Allergies     Medication List       Accurate as of June 05, 2018  7:51 PM. Always use your most recent med list.        ferrous sulfate 325 (65 FE) MG tablet TAKE ONE TABLET BY MOUTH EVERY DAY   gabapentin 300 MG capsule Commonly known as:  NEURONTIN Take 1 capsule (300 mg total) by mouth at bedtime.   NUVARING 0.12-0.015 MG/24HR vaginal ring Generic drug:  etonogestrel-ethinyl estradiol insert 1 ring vaginally for 3 weeks REMOVE for 1 week and repeat   omeprazole 20 MG capsule Commonly known as:  PRILOSEC Take 1 capsule (20 mg total) by mouth 2 (two) times daily before a meal.   sucralfate 1 g tablet Commonly known as:  CARAFATE Take 1 tablet (1 g total) by mouth 4 (four) times daily -  with meals and at bedtime.   sulfamethoxazole-trimethoprim 800-160 MG tablet Commonly known as:  BACTRIM DS,SEPTRA DS Take 1 tablet by mouth every 12 (twelve) hours.       Review of Systems  Constitutional: Negative.   HENT: Negative.   Eyes: Negative.   Respiratory: Negative.    Gastrointestinal: Negative.   Endocrine: Negative.   Genitourinary: Negative.   Musculoskeletal: Positive for arthralgias and joint swelling.       Numbness/Tingling/Pain in fingers, toes, and feet.   Mild ankle swelling.   Skin: Negative.   Allergic/Immunologic: Negative.   Neurological: Positive for dizziness, weakness and numbness.       Occasional  Hematological: Negative.   Psychiatric/Behavioral: Negative.     Social History   Tobacco Use  . Smoking status: Never Smoker  . Smokeless tobacco: Never Used  Substance Use Topics  . Alcohol use: No    Alcohol/week: 1.0 - 2.0 standard drinks    Types: 1 - 2 Glasses of wine per week   Objective:   BP 129/81 (BP Location: Right Arm, Patient Position: Sitting, Cuff Size: Normal)   Pulse 72   Temp 98 F (36.7 C)   Ht 5\' 3"  (1.6 m)   Wt 199 lb 14.4 oz (90.7 kg)   LMP  (LMP Unknown)   BMI 35.41 kg/m   Physical Exam Vitals signs reviewed.  Constitutional:      Appearance: She is well-developed.  Neck:     Musculoskeletal: Normal range of motion.  Pulmonary:     Effort: Pulmonary effort is normal.  Musculoskeletal: Normal range of motion.  Neurological:  Mental Status: She is alert and oriented to person, place, and time.     Cranial Nerves: No cranial nerve deficit.  Psychiatric:        Behavior: Behavior normal.        Thought Content: Thought content normal.        Judgment: Judgment normal.      Assessment & Plan:  1. Neuropathy Stable. Not worsening. She continues gabapentin 300 mg QHS.  Rheumatologist would like her to take it twice daily.  She will report to office if she has any other concerns.   2. Anemia, unspecified type Stable. Recent Hgb was 10.9 on 08/06/2017. She states that she takes oral iron daily.    Follow up in 3 months for Labs/OV.  Elmo Putt , PA-C 5/52/08

## 2018-06-05 NOTE — Patient Instructions (Signed)
I value your feedback and entrusting us with your care. If you get a [facility] patient survey, I would appreciate you taking the time to let us know about your experience today. Thank you! 

## 2018-06-06 LAB — HEMOGLOBIN A1C
Est. average glucose Bld gHb Est-mCnc: 100 mg/dL
Hgb A1c MFr Bld: 5.1 % (ref 4.8–5.6)

## 2018-06-06 LAB — CBC WITH DIFFERENTIAL/PLATELET
BASOS ABS: 0.1 10*3/uL (ref 0.0–0.2)
Basos: 1 %
EOS (ABSOLUTE): 0.1 10*3/uL (ref 0.0–0.4)
Eos: 1 %
Hematocrit: 33.9 % — ABNORMAL LOW (ref 34.0–46.6)
Hemoglobin: 11 g/dL — ABNORMAL LOW (ref 11.1–15.9)
Immature Grans (Abs): 0 10*3/uL (ref 0.0–0.1)
Immature Granulocytes: 0 %
LYMPHS ABS: 3 10*3/uL (ref 0.7–3.1)
Lymphs: 35 %
MCH: 27.4 pg (ref 26.6–33.0)
MCHC: 32.4 g/dL (ref 31.5–35.7)
MCV: 84 fL (ref 79–97)
MONOS ABS: 0.7 10*3/uL (ref 0.1–0.9)
Monocytes: 8 %
NEUTROS ABS: 4.6 10*3/uL (ref 1.4–7.0)
Neutrophils: 55 %
PLATELETS: 200 10*3/uL (ref 150–450)
RBC: 4.02 x10E6/uL (ref 3.77–5.28)
RDW: 12.3 % (ref 11.7–15.4)
WBC: 8.5 10*3/uL (ref 3.4–10.8)

## 2018-06-06 LAB — B12 AND FOLATE PANEL
FOLATE: 11.1 ng/mL (ref >3.0)
Vitamin B-12: 402 pg/mL (ref 232–1245)

## 2018-06-12 ENCOUNTER — Ambulatory Visit: Payer: Self-pay | Admitting: Adult Health Nurse Practitioner

## 2018-06-12 VITALS — BP 131/81 | HR 67 | Temp 98.8°F | Ht 62.0 in | Wt 196.1 lb

## 2018-06-12 DIAGNOSIS — K529 Noninfective gastroenteritis and colitis, unspecified: Secondary | ICD-10-CM

## 2018-06-12 NOTE — Progress Notes (Signed)
  Patient: Nancy Mcdowell Female    DOB: [birth year]   53 y.o.   MRN: [id number] Visit Date: 06/12/2018  Today's Provider: Staci Acosta, NP   Chief Complaint  Patient presents with  . Nausea    Chills, stomach pain, diarrhea, and hot flashes   Subjective:    HPI   Pt states that since Monday you upset stomach, chills on Monday, with nausea intermittently. No vomiting. + diarrhea going 3-4 times per day- has improved today with only one episode.   No Known Allergies Previous Medications   FERROUS SULFATE 325 (65 FE) MG TABLET    TAKE ONE TABLET BY MOUTH EVERY DAY   GABAPENTIN (NEURONTIN) 300 MG CAPSULE    Take 1 capsule (300 mg total) by mouth at bedtime.   NUVARING 0.12-0.015 MG/24HR VAGINAL RING    insert 1 ring vaginally for 3 weeks REMOVE for 1 week and repeat   OMEPRAZOLE (PRILOSEC) 20 MG CAPSULE    Take 1 capsule (20 mg total) by mouth 2 (two) times daily before a meal.   SUCRALFATE (CARAFATE) 1 G TABLET    Take 1 tablet (1 g total) by mouth 4 (four) times daily -  with meals and at bedtime.   SULFAMETHOXAZOLE-TRIMETHOPRIM (BACTRIM DS,SEPTRA DS) 800-160 MG TABLET    Take 1 tablet by mouth every 12 (twelve) hours.    Review of Systems  All other systems reviewed and are negative.   Social History   Tobacco Use  . Smoking status: Never Smoker  . Smokeless tobacco: Never Used  Substance Use Topics  . Alcohol use: No    Alcohol/week: 1.0 - 2.0 standard drinks    Types: 1 - 2 Glasses of wine per week   Objective:   BP 131/81   Pulse 67   Temp 98.8 F (37.1 C)   Ht 5\' 2"  (1.575 m)   Wt 196 lb 1.6 oz (89 kg)   LMP  (LMP Unknown)   BMI 35.87 kg/m   Physical Exam Constitutional:      Appearance: Normal appearance.  Cardiovascular:     Rate and Rhythm: Normal rate and regular rhythm.  Pulmonary:     Effort: Pulmonary effort is normal.  Abdominal:     General: Bowel sounds are normal. There is no distension.     Palpations: Abdomen is soft.     Tenderness:  There is no abdominal tenderness. There is no guarding.  Neurological:     Mental Status: She is alert.         Assessment & Plan:        Gastrooenteritis:  Supportive care.  Hydrate.  BRAT diet.  Given rx for  PRN Zofran.    Staci Acosta, NP   [facility]

## 2018-06-17 DIAGNOSIS — K529 Noninfective gastroenteritis and colitis, unspecified: Secondary | ICD-10-CM | POA: Insufficient documentation

## 2018-08-04 ENCOUNTER — Other Ambulatory Visit: Payer: Self-pay | Admitting: Internal Medicine

## 2018-09-04 ENCOUNTER — Other Ambulatory Visit: Payer: Self-pay

## 2018-09-04 ENCOUNTER — Ambulatory Visit: Payer: Self-pay | Admitting: Adult Health Nurse Practitioner

## 2018-09-04 DIAGNOSIS — Z0000 Encounter for general adult medical examination without abnormal findings: Secondary | ICD-10-CM

## 2018-09-04 DIAGNOSIS — D649 Anemia, unspecified: Secondary | ICD-10-CM

## 2018-09-04 DIAGNOSIS — G629 Polyneuropathy, unspecified: Secondary | ICD-10-CM

## 2018-09-04 NOTE — Progress Notes (Signed)
  Patient: Nancy Mcdowell Female    DOB: Jan 13, 1966   53 y.o.   MRN: [id number] Visit Date: 09/04/2018  Today's Provider: [facility]   No chief complaint on file.  Subjective:    HPI  Telephonic visit    Taking gabapentin 300mg  PRN QHS- taken 2-3 nights in the past two weeks.  No reports of worsening neuropathy.   Taking iron daily.   No Known Allergies Previous Medications   FERROUS SULFATE 325 (65 FE) MG TABLET    TAKE ONE TABLET BY MOUTH EVERY DAY   GABAPENTIN (NEURONTIN) 300 MG CAPSULE    Take 1 capsule (300 mg total) by mouth at bedtime.   NUVARING 0.12-0.015 MG/24HR VAGINAL RING    insert 1 ring vaginally for 3 weeks REMOVE for 1 week and repeat   OMEPRAZOLE (PRILOSEC) 20 MG CAPSULE    Take 1 capsule (20 mg total) by mouth 2 (two) times daily before a meal.   SUCRALFATE (CARAFATE) 1 G TABLET    Take 1 tablet (1 g total) by mouth 4 (four) times daily -  with meals and at bedtime.   SULFAMETHOXAZOLE-TRIMETHOPRIM (BACTRIM DS,SEPTRA DS) 800-160 MG TABLET    Take 1 tablet by mouth every 12 (twelve) hours.    Review of Systems  All other systems reviewed and are negative.   Social History   Tobacco Use  . Smoking status: Never Smoker  . Smokeless tobacco: Never Used  Substance Use Topics  . Alcohol use: No    Alcohol/week: 1.0 - 2.0 standard drinks    Types: 1 - 2 Glasses of wine per week   Objective:   LMP  (LMP Unknown)   Physical Exam  No PE.     Assessment & Plan:      Neuropathy: Continue gabapentin PRN.   Anemia: Continue iron- monitor for acute bleeding.  CBC along with routine labs prior to next visit.    [facility]

## 2018-11-26 ENCOUNTER — Ambulatory Visit: Payer: Self-pay | Admitting: Physician Assistant

## 2018-11-26 ENCOUNTER — Encounter: Payer: Self-pay | Admitting: Physician Assistant

## 2018-11-26 ENCOUNTER — Other Ambulatory Visit: Payer: Self-pay

## 2018-11-26 DIAGNOSIS — Z113 Encounter for screening for infections with a predominantly sexual mode of transmission: Secondary | ICD-10-CM

## 2018-11-26 LAB — WET PREP FOR TRICH, YEAST, CLUE
Trichomonas Exam: NEGATIVE
Yeast Exam: NEGATIVE

## 2018-11-26 NOTE — Progress Notes (Signed)
    STI clinic/screening visit  Subjective:  Nancy Mcdowell is a 53 y.o. female being seen today for an STI screening visit. The patient reports they do have symptoms.  Patient has the following medical conditionshas GERD (gastroesophageal reflux disease); Acanthosis nigricans; Pap smear abnormality of cervix/human papillomavirus (HPV) positive; Epigastric pain; Anemia; Lung field abnormal finding on examination; History of bloody stools; Neuropathy; and Gastroenteritis on their problem list.  Chief Complaint  Patient presents with  . SEXUALLY TRANSMITTED DISEASE    Patient reports internal and external vaginal irritation for about 1 month.  Denies other vaginal s/s, change of soap/detergent or lotions. HPI   See flowsheet for further details and programmatic requirements.    The following portions of the patient's history were reviewed and updated as appropriate: allergies, current medications, past family history, past medical history, past social history, past surgical history and problem list. Problem list updated.  Objective:  There were no vitals filed for this visit.  Physical Exam Constitutional:      General: She is not in acute distress.    Appearance: Normal appearance.  HENT:     Head: Normocephalic and atraumatic.     Mouth/Throat:     Mouth: Mucous membranes are moist.     Pharynx: Oropharynx is clear. No oropharyngeal exudate or posterior oropharyngeal erythema.  Neck:     Musculoskeletal: Neck supple.  Pulmonary:     Effort: Pulmonary effort is normal.  Abdominal:     Palpations: Abdomen is soft. There is no mass.     Tenderness: There is no abdominal tenderness. There is no guarding.  Genitourinary:    General: Normal vulva.     Rectum: Normal.     Comments: External genitalia/pubic area is normal female without nits, lice, edema, erythema, lesions and inguinal adenopathy. Vagina with normal mucosa and discharge. Cervix without visible lesions Uterus  normal size, firm, mobile, nt, no CMT, no masses, no adnexal tenderness or fullness. Lymphadenopathy:     Cervical: No cervical adenopathy.  Skin:    General: Skin is warm and dry.     Findings: No bruising, erythema, lesion or rash.  Neurological:     Mental Status: She is alert and oriented to person, place, and time.  Psychiatric:        Mood and Affect: Mood normal.        Behavior: Behavior normal.        Thought Content: Thought content normal.        Judgment: Judgment normal.       Assessment and Plan:  Nancy Mcdowell is a 53 y.o. female presenting to the [facility] Department for STI screening  1. Screening for STD (sexually transmitted disease) Patient with complaint of vaginal irritation but normal exam today. Reviewed wet mount results with patient and no treatment indicated today. Await test results.  Counseled that RN will call if she needs to RTC for any treatment once results are back Counseled that patient could get an OTC yeast treatment to use for symptom relief while waits for other results. - WET PREP FOR [place], YEAST, CLUE - HIV [place] [facility] - Syphilis Serology, [facility] - Chlamydia/Gonorrhea [facility]     No follow-ups on file.  Future Appointments  Date Time Provider [place]  01/01/2019  6:15 PM [facility] NURSE [facility] None  01/08/2019  6:30 PM [facility] [place] [facility] None    Jerene Dilling, PA

## 2018-11-27 ENCOUNTER — Other Ambulatory Visit: Payer: Self-pay

## 2018-12-04 ENCOUNTER — Ambulatory Visit: Payer: Self-pay

## 2019-01-01 ENCOUNTER — Other Ambulatory Visit: Payer: Self-pay

## 2019-01-07 ENCOUNTER — Other Ambulatory Visit: Payer: Self-pay

## 2019-01-07 DIAGNOSIS — Z0000 Encounter for general adult medical examination without abnormal findings: Secondary | ICD-10-CM

## 2019-01-07 DIAGNOSIS — G629 Polyneuropathy, unspecified: Secondary | ICD-10-CM

## 2019-01-07 DIAGNOSIS — D649 Anemia, unspecified: Secondary | ICD-10-CM

## 2019-01-08 ENCOUNTER — Ambulatory Visit: Payer: Self-pay

## 2019-01-08 LAB — LIPID PANEL
Chol/HDL Ratio: 3.8 ratio (ref 0.0–4.4)
Cholesterol, Total: 173 mg/dL (ref 100–199)
HDL: 45 mg/dL (ref >39)
LDL Calculated: 109 mg/dL — ABNORMAL HIGH (ref 0–99)
Triglycerides: 93 mg/dL (ref 0–149)
VLDL Cholesterol Cal: 19 mg/dL (ref 5–40)

## 2019-01-08 LAB — COMPREHENSIVE METABOLIC PANEL
ALT: 14 IU/L (ref 0–32)
AST: 18 IU/L (ref 0–40)
Albumin/Globulin Ratio: 1.4 (ref 1.2–2.2)
Albumin: 4.5 g/dL (ref 3.8–4.9)
Alkaline Phosphatase: 111 IU/L (ref 39–117)
BUN/Creatinine Ratio: 17 (ref 9–23)
BUN: 15 mg/dL (ref 6–24)
Bilirubin Total: 0.2 mg/dL (ref 0.0–1.2)
CO2: 23 mmol/L (ref 20–29)
Calcium: 9.4 mg/dL (ref 8.7–10.2)
Chloride: 102 mmol/L (ref 96–106)
Creatinine, Ser: 0.9 mg/dL (ref 0.57–1.00)
GFR calc Af Amer: 86 mL/min/{1.73_m2} (ref >59)
GFR calc non Af Amer: 74 mL/min/{1.73_m2} (ref >59)
Globulin, Total: 3.3 g/dL (ref 1.5–4.5)
Glucose: 103 mg/dL — ABNORMAL HIGH (ref 65–99)
Potassium: 4.1 mmol/L (ref 3.5–5.2)
Sodium: 140 mmol/L (ref 134–144)
Total Protein: 7.8 g/dL (ref 6.0–8.5)

## 2019-01-08 LAB — CBC
Hematocrit: 36.3 % (ref 34.0–46.6)
Hemoglobin: 11.5 g/dL (ref 11.1–15.9)
MCH: 28.1 pg (ref 26.6–33.0)
MCHC: 31.7 g/dL (ref 31.5–35.7)
MCV: 89 fL (ref 79–97)
Platelets: 191 10*3/uL (ref 150–450)
RBC: 4.09 x10E6/uL (ref 3.77–5.28)
RDW: 12.3 % (ref 11.7–15.4)
WBC: 7 10*3/uL (ref 3.4–10.8)

## 2019-01-08 LAB — IRON,TIBC AND FERRITIN PANEL
Ferritin: 281 ng/mL — ABNORMAL HIGH (ref 15–150)
Iron Saturation: 19 % (ref 15–55)
Iron: 53 ug/dL (ref 27–159)
Total Iron Binding Capacity: 281 ug/dL (ref 250–450)
UIBC: 228 ug/dL (ref 131–425)

## 2019-01-08 LAB — TSH: TSH: 3.31 u[IU]/mL (ref 0.450–4.500)

## 2019-01-15 ENCOUNTER — Other Ambulatory Visit: Payer: Self-pay

## 2019-01-15 ENCOUNTER — Encounter: Payer: Self-pay | Admitting: Gerontology

## 2019-01-15 ENCOUNTER — Ambulatory Visit: Payer: Self-pay | Admitting: Gerontology

## 2019-01-15 VITALS — BP 126/82 | HR 71 | Temp 97.3°F | Ht 63.0 in | Wt 192.8 lb

## 2019-01-15 DIAGNOSIS — K219 Gastro-esophageal reflux disease without esophagitis: Secondary | ICD-10-CM

## 2019-01-15 DIAGNOSIS — G629 Polyneuropathy, unspecified: Secondary | ICD-10-CM

## 2019-01-15 DIAGNOSIS — D649 Anemia, unspecified: Secondary | ICD-10-CM

## 2019-01-15 DIAGNOSIS — Z0000 Encounter for general adult medical examination without abnormal findings: Secondary | ICD-10-CM

## 2019-01-15 NOTE — Progress Notes (Signed)
 Established Patient Office Visit  Subjective:  Patient ID: Nancy Mcdowell, female    DOB: 09/20/1965  Age: 53 y.o. MRN: [id number]  CC: No chief complaint on file. Patient consents to telephone visit and 2 patient identifiers was used to identify patient.  HPI Nancy Mcdowell presents for follow up of Anemia, Neuropathy, lab review and medication refill. She reports that she's compliant with her medications, making healthy choices  And exercises as tolerated. Her complete blood count done 8 days ago was within normal limits. Her iron study, Ferritin was 281 ng/ml and LDL was 109 mg/dl. She denies chest pain, palpitation, shortness of breath, fever, chills and denies no further concerns.  Past Medical History:  Diagnosis Date  . Anemia 12/25/2016  . Cataract   . GERD (gastroesophageal reflux disease)   . Heart murmur    Said she was told she had one at 20    Past Surgical History:  Procedure Laterality Date  . COLONOSCOPY WITH PROPOFOL N/A 02/04/2017   Procedure: COLONOSCOPY WITH PROPOFOL;  Surgeon: Anna, Kiran, MD;  Location: [facility] ENDOSCOPY;  Service: Gastroenterology;  Laterality: N/A;  . EYE SURGERY Left 2010   retinal detachment  . RETINAL DETACHMENT SURGERY Left 2010, 2011   2    Family History  Problem Relation Age of Onset  . Hyperlipidemia Mother   . Hypertension Mother   . Stroke Mother   . Arthritis Mother   . Kidney disease Mother   . Thyroid disease Mother   . Kidney disease Father   . Heart attack Father   . Diabetes Father   . Cancer Maternal Aunt        Breast Cancer  . Breast cancer Maternal Aunt 50  . Parkinson's disease Maternal Grandmother   . Heart attack Paternal Grandfather   . Breast cancer Cousin   . Breast cancer Maternal Aunt 60    Social History   Socioeconomic History  . Marital status: Legally Separated    Spouse name: Not on file  . Number of children: Not on file  . Years of education: Not on file  . Highest education level:  Not on file  Occupational History  . Not on file  Social Needs  . Financial resource strain: Not on file  . Food insecurity    Worry: Not on file    Inability: Not on file  . Transportation needs    Medical: Not on file    Non-medical: Not on file  Tobacco Use  . Smoking status: Never Smoker  . Smokeless tobacco: Never Used  Substance and Sexual Activity  . Alcohol use: No    Alcohol/week: 1.0 - 2.0 standard drinks    Types: 1 - 2 Glasses of wine per week  . Drug use: No  . Sexual activity: Yes    Partners: Male    Birth control/protection: Other-see comments    Comment: Nuvaring  Lifestyle  . Physical activity    Days per week: Not on file    Minutes per session: Not on file  . Stress: Not on file  Relationships  . Social connections    Talks on phone: Not on file    Gets together: Not on file    Attends religious service: Not on file    Active member of club or organization: Not on file    Attends meetings of clubs or organizations: Not on file    Relationship status: Not on file  . Intimate partner violence      Fear of current or ex partner: Not on file    Emotionally abused: Not on file    Physically abused: Not on file    Forced sexual activity: Not on file  Other Topics Concern  . Not on file  Social History Narrative  . Not on file    Outpatient Medications Prior to Visit  Medication Sig Dispense Refill  . gabapentin (NEURONTIN) 300 MG capsule Take 1 capsule (300 mg total) by mouth at bedtime. 30 capsule 3  . omeprazole (PRILOSEC) 20 MG capsule Take 1 capsule (20 mg total) by mouth 2 (two) times daily before a meal. 60 capsule 3  . ferrous sulfate 325 (65 FE) MG tablet TAKE ONE TABLET BY MOUTH EVERY DAY 60 tablet 0  . NUVARING 0.12-0.015 MG/24HR vaginal ring insert 1 ring vaginally for 3 weeks REMOVE for 1 week and repeat (Patient not taking: Reported on 02/27/2018) 1 each 12  . sucralfate (CARAFATE) 1 g tablet Take 1 tablet (1 g total) by mouth 4 (four)  times daily -  with meals and at bedtime. (Patient not taking: Reported on 01/15/2019) 60 tablet 3  . sulfamethoxazole-trimethoprim (BACTRIM DS,SEPTRA DS) 800-160 MG tablet Take 1 tablet by mouth every 12 (twelve) hours. (Patient not taking: Reported on 02/27/2018) 14 tablet 0   No facility-administered medications prior to visit.     No Known Allergies  ROS Review of Systems  Constitutional: Negative.   Eyes: Negative.   Respiratory: Negative.   Cardiovascular: Negative.   Gastrointestinal: Negative.   Musculoskeletal: Negative.   Skin: Negative.   Neurological: Negative.   Psychiatric/Behavioral: Negative.       Objective:    Physical Exam No vital sign and PE was done. BP 126/82 Comment: taken 01/07/2019  Pulse 71   Temp (!) 97.3 F (36.3 C)   Ht 5' 3" (1.6 m)   Wt 192 lb 12.8 oz (87.5 kg)   LMP  (LMP Unknown)   SpO2 97%   BMI 34.15 kg/m  Wt Readings from Last 3 Encounters:  01/15/19 192 lb 12.8 oz (87.5 kg)  06/12/17 202 lb (91.6 kg)  06/12/18 196 lb 1.6 oz (89 kg)   She was encouraged to continue on weight loss regimen.   There are no preventive care reminders to display for this patient.  There are no preventive care reminders to display for this patient.  Lab Results  Component Value Date   TSH 3.310 01/07/2019   Lab Results  Component Value Date   WBC 7.0 01/07/2019   HGB 11.5 01/07/2019   HCT 36.3 01/07/2019   MCV 89 01/07/2019   PLT 191 01/07/2019   Lab Results  Component Value Date   NA 140 01/07/2019   K 4.1 01/07/2019   CO2 23 01/07/2019   GLUCOSE 103 (H) 01/07/2019   BUN 15 01/07/2019   CREATININE 0.90 01/07/2019   BILITOT 0.2 01/07/2019   ALKPHOS 111 01/07/2019   AST 18 01/07/2019   ALT 14 01/07/2019   PROT 7.8 01/07/2019   ALBUMIN 4.5 01/07/2019   CALCIUM 9.4 01/07/2019   ANIONGAP 7 12/01/2016   Lab Results  Component Value Date   CHOL 173 01/07/2019   Lab Results  Component Value Date   HDL 45 01/07/2019   Lab Results   Component Value Date   LDLCALC 109 (H) 01/07/2019   Lab Results  Component Value Date   TRIG 93 01/07/2019   Lab Results  Component Value Date   CHOLHDL 3.8 01/07/2019  Lab Results  Component Value Date   HGBA1C 5.1 06/05/2018      Assessment & Plan:     1. Gastroesophageal reflux disease, esophagitis presence not specified - Her acid reflux is controlled and she will continue on current treatment regimen.  2. Neuropathy - She reports that it's controlled and will continue on current treatment regimen.  3. Anemia, unspecified type - Her Ferritin level was 281 ng/ml, her Ferrous sulfate was discontinued. She was advised to continue on iron rich diet.  4. Healthcare maintenance - Will recheck labs and she declined Influenza vaccine. She was advised to continue on low fat low cholesterol diet. - Comp Met (CMET); Future - Lipid panel; Future - CBC w/Diff; Future   Follow-up: Return in about 5 months (around 06/17/2019), or if symptoms worsen or fail to improve.    Chioma E [place], NP 

## 2019-01-15 NOTE — Patient Instructions (Addendum)
Fat and Cholesterol Restricted Eating Plan Getting too much fat and cholesterol in your diet may cause health problems. Choosing the right foods helps keep your fat and cholesterol at normal levels. This can keep you from getting certain diseases. Your doctor may recommend an eating plan that includes:  Total fat: ______% or less of total calories a day.  Saturated fat: ______% or less of total calories a day.  Cholesterol: less than _________mg a day.  Fiber: ______g a day. What are tips for following this plan? Meal planning  At meals, divide your plate into four equal parts: ? Fill one-half of your plate with vegetables and green salads. ? Fill one-fourth of your plate with whole grains. ? Fill one-fourth of your plate with low-fat (lean) protein foods.  Eat fish that is high in omega-3 fats at least two times a week. This includes mackerel, tuna, sardines, and salmon.  Eat foods that are high in fiber, such as whole grains, beans, apples, broccoli, carrots, peas, and barley. General tips   Work with your doctor to lose weight if you need to.  Avoid: ? Foods with added sugar. ? Fried foods. ? Foods with partially hydrogenated oils.  Limit alcohol intake to no more than 1 drink a day for nonpregnant women and 2 drinks a day for men. One drink equals 12 oz of beer, 5 oz of wine, or 1 oz of hard liquor. Reading food labels  Check food labels for: ? Trans fats. ? Partially hydrogenated oils. ? Saturated fat (g) in each serving. ? Cholesterol (mg) in each serving. ? Fiber (g) in each serving.  Choose foods with healthy fats, such as: ? Monounsaturated fats. ? Polyunsaturated fats. ? Omega-3 fats.  Choose grain products that have whole grains. Look for the word "whole" as the first word in the ingredient list. Cooking  Cook foods using low-fat methods. These include baking, boiling, grilling, and broiling.  Eat more home-cooked foods. Eat at restaurants and buffets  less often.  Avoid cooking using saturated fats, such as butter, cream, palm oil, palm kernel oil, and coconut oil. Recommended foods  Fruits  All fresh, canned (in natural juice), or frozen fruits. Vegetables  Fresh or frozen vegetables (raw, steamed, roasted, or grilled). Green salads. Grains  Whole grains, such as whole wheat or whole grain breads, crackers, cereals, and pasta. Unsweetened oatmeal, bulgur, barley, quinoa, or brown rice. Corn or whole wheat flour tortillas. Meats and other protein foods  Ground beef (85% or leaner), grass-fed beef, or beef trimmed of fat. Skinless chicken or turkey. Ground chicken or turkey. Pork trimmed of fat. All fish and seafood. Egg whites. Dried beans, peas, or lentils. Unsalted nuts or seeds. Unsalted canned beans. Nut butters without added sugar or oil. Dairy  Low-fat or nonfat dairy products, such as skim or 1% milk, 2% or reduced-fat cheeses, low-fat and fat-free ricotta or cottage cheese, or plain low-fat and nonfat yogurt. Fats and oils  Tub margarine without trans fats. Light or reduced-fat mayonnaise and salad dressings. Avocado. Olive, canola, sesame, or safflower oils. The items listed above may not be a complete list of foods and beverages you can eat. Contact a dietitian for more information. Foods to avoid Fruits  Canned fruit in heavy syrup. Fruit in cream or butter sauce. Fried fruit. Vegetables  Vegetables cooked in cheese, cream, or butter sauce. Fried vegetables. Grains  White bread. White pasta. White rice. Cornbread. Bagels, pastries, and croissants. Crackers and snack foods that contain trans fat   and hydrogenated oils. Meats and other protein foods  Fatty cuts of meat. Ribs, chicken wings, bacon, sausage, bologna, salami, chitterlings, fatback, hot dogs, bratwurst, and packaged lunch meats. Liver and organ meats. Whole eggs and egg yolks. Chicken and [place] with skin. Fried meat. Dairy  Whole or 2% milk, cream,  half-and-half, and cream cheese. Whole milk cheeses. Whole-fat or sweetened yogurt. Full-fat cheeses. Nondairy creamers and whipped toppings. Processed cheese, cheese spreads, and cheese curds. Beverages  Alcohol. Sugar-sweetened drinks such as sodas, lemonade, and fruit drinks. Fats and oils  Butter, stick margarine, lard, shortening, ghee, or bacon fat. Coconut, palm kernel, and palm oils. Sweets and desserts  Corn syrup, sugars, honey, and molasses. Candy. Jam and jelly. Syrup. Sweetened cereals. Cookies, pies, cakes, donuts, muffins, and ice cream. The items listed above may not be a complete list of foods and beverages you should avoid. Contact a dietitian for more information. Summary  Choosing the right foods helps keep your fat and cholesterol at normal levels. This can keep you from getting certain diseases.  At meals, fill one-half of your plate with vegetables and green salads.  Eat high-fiber foods, like whole grains, beans, apples, carrots, peas, and barley.  Limit added sugar, saturated fats, alcohol, and fried foods. This information is not intended to replace advice given to you by your health care provider. Make sure you discuss any questions you have with your health care provider. Document Released: 10/30/2011 Document Revised: 01/01/2018 Document Reviewed: 01/15/2017 Elsevier Patient Education  [place].  Iron-Rich Diet  Iron is a mineral that helps your body to produce hemoglobin. Hemoglobin is a protein in red blood cells that carries oxygen to your body's tissues. Eating too little iron may cause you to feel weak and tired, and it can increase your risk of infection. Iron is naturally found in many foods, and many foods have iron added to them (iron-fortified foods). You may need to follow an iron-rich diet if you do not have enough iron in your body due to certain medical conditions. The amount of iron that you need each day depends on your age, your sex,  and any medical conditions you have. Follow instructions from your health care provider or a diet and nutrition specialist (dietitian) about how much iron you should eat each day. What are tips for following this plan? Reading food labels  Check food labels to see how many milligrams (mg) of iron are in each serving. Cooking  Cook foods in pots and pans that are made from iron.  Take these steps to make it easier for your body to absorb iron from certain foods: ? Soak beans overnight before cooking. ? Soak whole grains overnight and drain them before using. ? Ferment flours before baking, such as by using yeast in bread dough. Meal planning  When you eat foods that contain iron, you should eat them with foods that are high in vitamin C. These include oranges, peppers, tomatoes, potatoes, and mango. Vitamin C helps your body to absorb iron. General information  Take iron supplements only as told by your health care provider. An overdose of iron can be life-threatening. If you were prescribed iron supplements, take them with orange juice or a vitamin C supplement.  When you eat iron-fortified foods or take an iron supplement, you should also eat foods that naturally contain iron, such as meat, poultry, and fish. Eating naturally iron-rich foods helps your body to absorb the iron that is added to other foods or  contained in a supplement.  Certain foods and drinks prevent your body from absorbing iron properly. Avoid eating these foods in the same meal as iron-rich foods or with iron supplements. These foods include: ? Coffee, black tea, and red wine. ? Milk, dairy products, and foods that are high in calcium. ? Beans and soybeans. ? Whole grains. What foods should I eat? Fruits Prunes. Raisins. Eat fruits high in vitamin C, such as oranges, grapefruits, and strawberries, alongside iron-rich foods. Vegetables Spinach (cooked). Green peas. Broccoli. Fermented vegetables. Eat vegetables  high in vitamin C, such as leafy greens, potatoes, bell peppers, and tomatoes, alongside iron-rich foods. Grains Iron-fortified breakfast cereal. Iron-fortified whole-wheat bread. Enriched rice. Sprouted grains. Meats and other proteins Beef liver. Oysters. Beef. Shrimp. [place]. Chicken. [place]. Sardines. Chickpeas. Nuts. Tofu. Pumpkin seeds. Beverages Tomato juice. Fresh orange juice. Prune juice. Hibiscus tea. Fortified instant breakfast shakes. Sweets and desserts Blackstrap molasses. Seasonings and condiments Tahini. Fermented soy sauce. Other foods Wheat germ. The items listed above may not be a complete list of recommended foods and beverages. Contact a dietitian for more information. What foods should I avoid? Grains Whole grains. Bran cereal. Bran flour. Oats. Meats and other proteins Soybeans. Products made from soy protein. Black beans. Lentils. Mung beans. Split peas. Dairy Milk. Cream. Cheese. Yogurt. Cottage cheese. Beverages Coffee. Black tea. Red wine. Sweets and desserts Cocoa. Chocolate. Ice cream. Other foods Basil. Oregano. Large amounts of parsley. The items listed above may not be a complete list of foods and beverages to avoid. Contact a dietitian for more information. Summary  Iron is a mineral that helps your body to produce hemoglobin. Hemoglobin is a protein in red blood cells that carries oxygen to your body's tissues.  Iron is naturally found in many foods, and many foods have iron added to them (iron-fortified foods).  When you eat foods that contain iron, you should eat them with foods that are high in vitamin C. Vitamin C helps your body to absorb iron.  Certain foods and drinks prevent your body from absorbing iron properly, such as whole grains and dairy products. You should avoid eating these foods in the same meal as iron-rich foods or with iron supplements. This information is not intended to replace advice given to you by your health care  provider. Make sure you discuss any questions you have with your health care provider. Document Released: 12/12/2004 Document Revised: 04/12/2017 Document Reviewed: 03/26/2017 Elsevier Patient Education  2020 [employer].

## 2019-03-05 ENCOUNTER — Telehealth: Payer: Self-pay | Admitting: Pharmacy Technician

## 2019-03-05 ENCOUNTER — Other Ambulatory Visit: Payer: Self-pay

## 2019-03-05 MED ORDER — OMEPRAZOLE 20 MG PO CPDR: 20.0000 mg | DELAYED_RELEASE_CAPSULE | Freq: Two times a day (BID) | ORAL | 3 refills | Status: DC

## 2019-03-05 NOTE — Telephone Encounter (Signed)
Received updated proof of income.  Patient eligible to receive medication assistance at [facility] as long as they continue to meet eligibility criteria .  [facility]

## 2019-05-04 ENCOUNTER — Ambulatory Visit: Payer: Self-pay | Admitting: Family Medicine

## 2019-05-04 ENCOUNTER — Encounter: Payer: Self-pay | Admitting: Family Medicine

## 2019-05-04 ENCOUNTER — Other Ambulatory Visit: Payer: Self-pay

## 2019-05-04 DIAGNOSIS — Z113 Encounter for screening for infections with a predominantly sexual mode of transmission: Secondary | ICD-10-CM

## 2019-05-04 DIAGNOSIS — Z780 Asymptomatic menopausal state: Secondary | ICD-10-CM | POA: Insufficient documentation

## 2019-05-04 DIAGNOSIS — R87618 Other abnormal cytological findings on specimens from cervix uteri: Secondary | ICD-10-CM

## 2019-05-04 DIAGNOSIS — N898 Other specified noninflammatory disorders of vagina: Secondary | ICD-10-CM

## 2019-05-04 DIAGNOSIS — R8789 Other abnormal findings in specimens from female genital organs: Secondary | ICD-10-CM

## 2019-05-04 MED ORDER — CLOTRIMAZOLE 1 % VA CREA: 1.0000 | TOPICAL_CREAM | Freq: Every day | VAGINAL | 0 refills | Status: AC

## 2019-05-04 NOTE — Progress Notes (Signed)
[facility] Department STI clinic/screening visit  Subjective:  Nancy Mcdowell is a 53 y.o. female being seen today for an STI screening visit. The patient reports they do have symptoms.  Patient is post menopausal.  No LMP recorded (lmp unknown). Patient is postmenopausal.  Patient has the following medical conditions:   Patient Active Problem List   Diagnosis Date Noted  . Postmenopausal 05/04/2019  . Gastroenteritis 06/17/2018  . Neuropathy 06/11/2017  . History of bloody stools 01/08/2017  . Epigastric pain 12/25/2016  . Anemia 12/25/2016  . Lung field abnormal finding on examination 12/25/2016  . Pap smear abnormality of cervix/human papillomavirus (HPV) positive 05/28/2016  . Acanthosis nigricans 05/22/2016  . GERD (gastroesophageal reflux disease) 05/10/2015    Chief Complaint  Patient presents with  . SEXUALLY TRANSMITTED DISEASE    HPI  Patient reports severe vaginal itching for several weeks, associated with some discharge. Reports feeling like she is scratching herself raw.  No sex since late July.  Reports no known triggers.   See flowsheet for further details and programmatic requirements.    The following portions of the patient's history were reviewed and updated as appropriate: allergies, current medications, past medical history, past social history, past surgical history and problem list.  Objective:  There were no vitals filed for this visit.  Physical Exam Vitals and nursing note reviewed.  Constitutional:      Appearance: Normal appearance.  HENT:     Head: Normocephalic and atraumatic.     Mouth/Throat:     Mouth: Mucous membranes are moist.     Pharynx: Oropharynx is clear. No oropharyngeal exudate or posterior oropharyngeal erythema.  Pulmonary:     Effort: Pulmonary effort is normal.  Abdominal:     General: Abdomen is flat.     Palpations: There is no mass.     Tenderness: There is no abdominal tenderness. There is no rebound.   Genitourinary:    General: Normal vulva.     Exam position: Lithotomy position.     Pubic Area: No rash or pubic lice.      Labia:        Right: No rash or lesion.        Left: No rash or lesion.      Vagina: Vaginal discharge (white, thin and scant. pH 4.5) present. No erythema, bleeding or lesions.     Cervix: No cervical motion tenderness, discharge, friability, lesion or erythema.     Uterus: Normal.      Adnexa: Right adnexa normal and left adnexa normal.     Rectum: Normal.  Lymphadenopathy:     Head:     Right side of head: No preauricular or posterior auricular adenopathy.     Left side of head: No preauricular or posterior auricular adenopathy.     Cervical: No cervical adenopathy.     Upper Body:     Right upper body: No supraclavicular or axillary adenopathy.     Left upper body: No supraclavicular or axillary adenopathy.     Lower Body: No right inguinal adenopathy. No left inguinal adenopathy.  Skin:    General: Skin is warm and dry.     Findings: No rash.  Neurological:     Mental Status: She is alert and oriented to person, place, and time.      Assessment and Plan:  Nancy Mcdowell is a 53 y.o. female presenting to the [facility] Department for STI screening  1. Screening examination for  venereal disease Accepts all screenings including Hep C and Hep B Treat for yeast per standing order - HIV/HCV [place] [facility] - Syphilis Serology, [place] [facility] - HBV Antigen/Antibody [place] [facility] - [place], YEAST, CLUE  2. Postmenopausal  3. Vaginal discharge  4. Vaginal itching   Return if symptoms worsen or fail to improve.  Future Appointments  Date Time Provider [place]  06/10/2019 12:15 PM [facility] NURSE [facility] None  06/17/2019  9:00 AM [place], Chioma E, NP [facility] None    Caren Macadam, MD

## 2019-05-04 NOTE — Progress Notes (Signed)
Here today for STD screening. Accepts bloodwork. Derhonda Eastlick, RN ° °

## 2019-05-04 NOTE — Progress Notes (Signed)
[employer] results reviewed. Patient treated for Yeast per provider orders. Hal Morales, RN

## 2019-05-05 LAB — WET PREP FOR TRICH, YEAST, CLUE
Trichomonas Exam: NEGATIVE
Yeast Exam: NEGATIVE

## 2019-05-14 LAB — HM HIV SCREENING LAB: HM HIV Screening: NEGATIVE

## 2019-05-14 LAB — HEPATITIS B SURFACE ANTIGEN

## 2019-05-14 LAB — HM HEPATITIS C SCREENING LAB: HM Hepatitis Screen: NEGATIVE

## 2019-06-10 ENCOUNTER — Other Ambulatory Visit: Payer: Self-pay

## 2019-06-17 ENCOUNTER — Ambulatory Visit: Payer: Self-pay | Admitting: Gerontology

## 2019-06-18 ENCOUNTER — Other Ambulatory Visit: Payer: Self-pay

## 2019-06-18 VITALS — BP 110/73 | HR 82 | Ht 63.0 in | Wt 200.4 lb

## 2019-06-18 DIAGNOSIS — Z0000 Encounter for general adult medical examination without abnormal findings: Secondary | ICD-10-CM

## 2019-06-18 DIAGNOSIS — G629 Polyneuropathy, unspecified: Secondary | ICD-10-CM

## 2019-06-18 NOTE — Addendum Note (Signed)
Addended by: Fabiola Backer on: 06/18/2019 07:41 PM   Modules accepted: Orders

## 2019-06-19 LAB — COMPREHENSIVE METABOLIC PANEL
ALT: 15 IU/L (ref 0–32)
AST: 19 IU/L (ref 0–40)
Albumin/Globulin Ratio: 1.3 (ref 1.2–2.2)
Albumin: 4.3 g/dL (ref 3.8–4.9)
Alkaline Phosphatase: 101 IU/L (ref 39–117)
BUN/Creatinine Ratio: 14 (ref 9–23)
BUN: 12 mg/dL (ref 6–24)
Bilirubin Total: 0.3 mg/dL (ref 0.0–1.2)
CO2: 22 mmol/L (ref 20–29)
Calcium: 9.1 mg/dL (ref 8.7–10.2)
Chloride: 103 mmol/L (ref 96–106)
Creatinine, Ser: 0.83 mg/dL (ref 0.57–1.00)
GFR calc Af Amer: 94 mL/min/{1.73_m2} (ref >59)
GFR calc non Af Amer: 82 mL/min/{1.73_m2} (ref >59)
Globulin, Total: 3.4 g/dL (ref 1.5–4.5)
Glucose: 81 mg/dL (ref 65–99)
Potassium: 4 mmol/L (ref 3.5–5.2)
Sodium: 140 mmol/L (ref 134–144)
Total Protein: 7.7 g/dL (ref 6.0–8.5)

## 2019-06-19 LAB — CBC WITH DIFFERENTIAL/PLATELET
Basophils Absolute: 0.1 10*3/uL (ref 0.0–0.2)
Basos: 1 %
EOS (ABSOLUTE): 0.1 10*3/uL (ref 0.0–0.4)
Eos: 1 %
Hematocrit: 36 % (ref 34.0–46.6)
Hemoglobin: 11.6 g/dL (ref 11.1–15.9)
Immature Grans (Abs): 0 10*3/uL (ref 0.0–0.1)
Immature Granulocytes: 0 %
Lymphocytes Absolute: 2.8 10*3/uL (ref 0.7–3.1)
Lymphs: 39 %
MCH: 27.8 pg (ref 26.6–33.0)
MCHC: 32.2 g/dL (ref 31.5–35.7)
MCV: 86 fL (ref 79–97)
Monocytes Absolute: 0.6 10*3/uL (ref 0.1–0.9)
Monocytes: 8 %
Neutrophils Absolute: 3.8 10*3/uL (ref 1.4–7.0)
Neutrophils: 51 %
Platelets: 189 10*3/uL (ref 150–450)
RBC: 4.18 x10E6/uL (ref 3.77–5.28)
RDW: 11.9 % (ref 11.7–15.4)
WBC: 7.3 10*3/uL (ref 3.4–10.8)

## 2019-06-19 LAB — LIPID PANEL
Chol/HDL Ratio: 4 ratio (ref 0.0–4.4)
Cholesterol, Total: 181 mg/dL (ref 100–199)
HDL: 45 mg/dL (ref >39)
LDL Chol Calc (NIH): 124 mg/dL — ABNORMAL HIGH (ref 0–99)
Triglycerides: 65 mg/dL (ref 0–149)
VLDL Cholesterol Cal: 12 mg/dL (ref 5–40)

## 2019-06-25 ENCOUNTER — Ambulatory Visit: Payer: Self-pay | Admitting: Family Medicine

## 2019-06-25 ENCOUNTER — Telehealth: Payer: Self-pay

## 2019-06-25 NOTE — Telephone Encounter (Signed)
Patient called clinic several times regarding status of her phone visit.  Reached out to provider to make her aware patient had not been called.  Per Lanae Boast, FNP, [facility] provider attempted to reach her several times, however unable to reach patient so left messages.  Provider had finished phone visits for evening so instructed to reschedule.   Called patient to make her aware of the multiple attempts to reach her and the need to reschedule.  Per patient, no one had reached out to her and she doesn't pick up the phone if number displays as restricted.  Explained number may display that way due to provider and volunteers calling from offsite.  Encouraged her to pick up phone, even for restricted numbers, when expecting a phone visit.  Also informed patient that even though appointment time may be at a certain time, she may be contacted earlier or later than her scheduled time pending how fast visits move or if no one picks up.  She stated no one had told her that before. Rescheduled appointment for 07/02/19 at 6:30pm for another phone visit.  Reiterated with patient that she may receive a call earlier or later depending on how fast appointments move.

## 2019-07-02 ENCOUNTER — Ambulatory Visit: Payer: Self-pay

## 2019-07-16 ENCOUNTER — Ambulatory Visit: Payer: Self-pay | Admitting: Urology

## 2019-07-16 ENCOUNTER — Other Ambulatory Visit: Payer: Self-pay

## 2019-07-16 VITALS — BP 129/83 | HR 74 | Temp 97.4°F | Ht 63.0 in | Wt 198.0 lb

## 2019-07-16 DIAGNOSIS — Z0000 Encounter for general adult medical examination without abnormal findings: Secondary | ICD-10-CM

## 2019-07-16 DIAGNOSIS — R21 Rash and other nonspecific skin eruption: Secondary | ICD-10-CM

## 2019-07-16 DIAGNOSIS — D649 Anemia, unspecified: Secondary | ICD-10-CM

## 2019-07-16 DIAGNOSIS — K219 Gastro-esophageal reflux disease without esophagitis: Secondary | ICD-10-CM

## 2019-07-16 MED ORDER — PREDNISONE 10 MG (21) PO TBPK: ORAL_TABLET | ORAL | 0 refills | Status: DC

## 2019-07-16 MED ORDER — OMEPRAZOLE 20 MG PO CPDR: 20.0000 mg | DELAYED_RELEASE_CAPSULE | Freq: Two times a day (BID) | ORAL | 3 refills | Status: DC

## 2019-07-16 NOTE — Progress Notes (Signed)
  Patient: Nancy Mcdowell Female    DOB: [birth year]   54 y.o.   MRN: [id number] Visit Date: 07/16/2019  Today's Provider: Angelica   Chief Complaint  Patient presents with  . Follow-up    labs a few weeks ago  . Rash    Back of neck ~1 mo   Subjective:    HPI  Rash had appeared after wearing a new shirt.  She has tried several over-the-counter treatments without relief.  She states the rash is itchy.    No Known Allergies Previous Medications   GABAPENTIN (NEURONTIN) 300 MG CAPSULE    Take 1 capsule (300 mg total) by mouth at bedtime.    Review of Systems  Social History   Tobacco Use  . Smoking status: Never Smoker  . Smokeless tobacco: Never Used  Substance Use Topics  . Alcohol use: No    Alcohol/week: 1.0 - 2.0 standard drinks    Types: 1 - 2 Glasses of wine per week   Objective:   BP 129/83   Pulse 74   Temp (!) 97.4 F (36.3 C)   Ht 5\' 3"  (1.6 m)   Wt 198 lb (89.8 kg)   LMP  (LMP Unknown)   SpO2 98%   BMI 35.07 kg/m   Physical Exam      Assessment & Plan:   1. Rash  - prednisone dose pack given  - follow up if no improvement  Follow up in 4 months with labs prior   [facility]

## 2019-11-19 ENCOUNTER — Ambulatory Visit: Payer: Self-pay

## 2019-12-04 ENCOUNTER — Encounter: Payer: Self-pay | Admitting: Advanced Practice Midwife

## 2019-12-04 ENCOUNTER — Ambulatory Visit: Payer: Self-pay | Admitting: Advanced Practice Midwife

## 2019-12-04 ENCOUNTER — Other Ambulatory Visit: Payer: Self-pay

## 2019-12-04 DIAGNOSIS — T7412XA Child physical abuse, confirmed, initial encounter: Secondary | ICD-10-CM | POA: Insufficient documentation

## 2019-12-04 DIAGNOSIS — T7412XS Child physical abuse, confirmed, sequela: Secondary | ICD-10-CM

## 2019-12-04 DIAGNOSIS — Z113 Encounter for screening for infections with a predominantly sexual mode of transmission: Secondary | ICD-10-CM

## 2019-12-04 LAB — WET PREP FOR TRICH, YEAST, CLUE
Trichomonas Exam: NEGATIVE
Yeast Exam: NEGATIVE

## 2019-12-04 NOTE — Progress Notes (Signed)
Wet mount reviewed, no tx per standing order. Provider orders completed. 

## 2019-12-04 NOTE — Progress Notes (Signed)
[facility] Department STI clinic/screening visit  Subjective:  Nancy Mcdowell is a 54 y.o.SBF G3P3 nonsmoker female being seen today for an STI screening visit. The patient reports they do have symptoms.  Patient reports that they do not desire a pregnancy in the next year.   They reported they are not interested in discussing contraception today.  No LMP recorded (lmp unknown). Patient is postmenopausal.   Patient has the following medical conditions:   Patient Active Problem List   Diagnosis Date Noted  . Morbid obesity ([place]) 198 lb 12/04/2019  . Postmenopausal 05/04/2019  . Gastroenteritis 06/17/2018  . Neuropathy 06/11/2017  . History of bloody stools 01/08/2017  . Epigastric pain 12/25/2016  . Anemia 12/25/2016  . Lung field abnormal finding on examination 12/25/2016  . Pap smear abnormality of cervix/human papillomavirus (HPV) positive 05/28/2016  . Acanthosis nigricans 05/22/2016  . GERD (gastroesophageal reflux disease) 05/10/2015    No chief complaint on file.   HPI  Patient reports LMP 3 years ago.  Last MJ 20 years ago.  Last ETOH 11/28/19 (5 glasses wine) qo night.  Last sex 11/15/19 with condom. C/o internal and external itching x 3 wks  Last HIV test per patient/review of record was 05/14/19 Patient reports last pap was 2-3 years ago  See flowsheet for further details and programmatic requirements.    The following portions of the patient's history were reviewed and updated as appropriate: allergies, current medications, past medical history, past social history, past surgical history and problem list.  Objective:  There were no vitals filed for this visit.  Physical Exam Vitals and nursing note reviewed.  Constitutional:      Appearance: Normal appearance. She is obese.  HENT:     Head: Normocephalic and atraumatic.     Mouth/Throat:     Mouth: Mucous membranes are moist.     Pharynx: Oropharynx is clear. No oropharyngeal exudate or  posterior oropharyngeal erythema.  Eyes:     Conjunctiva/sclera: Conjunctivae normal.  Pulmonary:     Effort: Pulmonary effort is normal.  Abdominal:     Palpations: Abdomen is soft. There is no mass.     Tenderness: There is no abdominal tenderness. There is no rebound.     Comments: Poor tone, increased adipose, soft without tenderness  Genitourinary:    General: Normal vulva.     Exam position: Lithotomy position.     Pubic Area: No rash or pubic lice.      Labia:        Right: No rash or lesion.        Left: No rash or lesion.      Vagina: Normal. No vaginal discharge (white creamy leukorrhea, ph<4.5), erythema, bleeding or lesions.     Cervix: Normal.     Uterus: Normal.      Adnexa: Right adnexa normal and left adnexa normal.     Rectum: Normal.  Lymphadenopathy:     Head:     Right side of head: No preauricular or posterior auricular adenopathy.     Left side of head: No preauricular or posterior auricular adenopathy.     Cervical: No cervical adenopathy.     Upper Body:     Right upper body: No supraclavicular or axillary adenopathy.     Left upper body: No supraclavicular or axillary adenopathy.     Lower Body: No right inguinal adenopathy. No left inguinal adenopathy.  Skin:    General: Skin is warm and dry.  Findings: No rash.  Neurological:     Mental Status: She is alert and oriented to person, place, and time.      Assessment and Plan:  MEMORI SAMMON is a 54 y.o. female presenting to the [facility] Department for STI screening  1. Screening examination for venereal disease Treat wet mount per standing orders Immunization nurse consult - WET PREP FOR [place], YEAST, CLUE - Chlamydia/Gonorrhea [place] [facility] - Syphilis Serology, [place] [facility] - HIV [place] [facility] - Gonococcus culture     Return if symptoms worsen or fail to improve.  No future appointments.  Nancy Mcdowell, CNM

## 2019-12-09 LAB — GONOCOCCUS CULTURE

## 2019-12-23 ENCOUNTER — Ambulatory Visit: Payer: Self-pay | Admitting: Physician Assistant

## 2019-12-23 ENCOUNTER — Other Ambulatory Visit: Payer: Self-pay

## 2019-12-23 DIAGNOSIS — B3731 Acute candidiasis of vulva and vagina: Secondary | ICD-10-CM

## 2019-12-23 DIAGNOSIS — B373 Candidiasis of vulva and vagina: Secondary | ICD-10-CM

## 2019-12-23 DIAGNOSIS — Z113 Encounter for screening for infections with a predominantly sexual mode of transmission: Secondary | ICD-10-CM

## 2019-12-23 DIAGNOSIS — Z7189 Other specified counseling: Secondary | ICD-10-CM

## 2019-12-23 LAB — WET PREP FOR TRICH, YEAST, CLUE: Trichomonas Exam: NEGATIVE

## 2019-12-23 MED ORDER — CLOTRIMAZOLE 1 % VA CREA: 1.0000 | TOPICAL_CREAM | Freq: Every day | VAGINAL | 0 refills | Status: AC

## 2019-12-23 MED ORDER — CLOTRIMAZOLE 1 % VA CREA: 1.0000 | TOPICAL_CREAM | Freq: Every day | VAGINAL | 0 refills | Status: DC

## 2019-12-23 NOTE — Progress Notes (Signed)
Wet mount reviewed with provider, patient treated for yeast per SO.Marland KitchenJenetta Downer, RN

## 2019-12-25 ENCOUNTER — Encounter: Payer: Self-pay | Admitting: Physician Assistant
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
## 2020-02-15 ENCOUNTER — Encounter: Payer: Self-pay | Admitting: Advanced Practice Midwife

## 2020-02-15 ENCOUNTER — Other Ambulatory Visit: Payer: Self-pay

## 2020-02-15 ENCOUNTER — Ambulatory Visit: Payer: Self-pay | Admitting: Advanced Practice Midwife

## 2020-02-15 DIAGNOSIS — F1010 Alcohol abuse, uncomplicated: Secondary | ICD-10-CM

## 2020-02-15 DIAGNOSIS — Z113 Encounter for screening for infections with a predominantly sexual mode of transmission: Secondary | ICD-10-CM

## 2020-02-15 HISTORY — DX: Alcohol abuse, uncomplicated: F10.10

## 2020-02-15 LAB — WET PREP FOR TRICH, YEAST, CLUE
Trichomonas Exam: NEGATIVE
Yeast Exam: NEGATIVE

## 2020-02-15 MED ORDER — CLOTRIMAZOLE 1 % VA CREA: 1.0000 | TOPICAL_CREAM | Freq: Every day | VAGINAL | 0 refills | Status: AC

## 2020-02-15 NOTE — Progress Notes (Signed)
Consulted by RN due to patient concerns and wet mount results are negative.  Reviewed CNM note and verbal order given to RN to offer Clotrimazole for symptom relief while awaiting other test results.
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
## 2020-02-15 NOTE — Progress Notes (Signed)
Wet mount reviewed and is negative today. Reviewed wet mount with Antoine Primas, PA, as Ola Spurr, CNM unavailable at the time of posting, as pt is complaining of vaginal itching. Per Antoine Primas, PA pt can have Clotrimazole Vaginal cream for symptomatic relief while we wait on further TR's. Pt accepts and received Clotrimazole Vaginal cream per Antoine Primas, PA verbal order. Provider orders completed.

## 2020-02-20 LAB — GONOCOCCUS CULTURE

## 2020-05-11 ENCOUNTER — Other Ambulatory Visit: Payer: Self-pay

## 2020-05-11 ENCOUNTER — Ambulatory Visit: Payer: Self-pay | Admitting: Physician Assistant

## 2020-05-11 ENCOUNTER — Encounter: Payer: Self-pay | Admitting: Physician Assistant

## 2020-05-11 DIAGNOSIS — N760 Acute vaginitis: Secondary | ICD-10-CM

## 2020-05-11 DIAGNOSIS — Z299 Encounter for prophylactic measures, unspecified: Secondary | ICD-10-CM

## 2020-05-11 DIAGNOSIS — B9689 Other specified bacterial agents as the cause of diseases classified elsewhere: Secondary | ICD-10-CM

## 2020-05-11 DIAGNOSIS — Z113 Encounter for screening for infections with a predominantly sexual mode of transmission: Secondary | ICD-10-CM

## 2020-05-11 LAB — WET PREP FOR TRICH, YEAST, CLUE
Trichomonas Exam: NEGATIVE
Yeast Exam: NEGATIVE

## 2020-05-11 MED ORDER — METRONIDAZOLE 500 MG PO TABS
500.0000 mg | ORAL_TABLET | Freq: Two times a day (BID) | ORAL | 0 refills | Status: AC
Start: 2020-05-11 — End: 2020-05-18

## 2020-05-11 MED ORDER — CLOTRIMAZOLE 1 % VA CREA: 1.0000 | TOPICAL_CREAM | Freq: Every day | VAGINAL | 0 refills | Status: AC

## 2020-05-11 NOTE — Progress Notes (Signed)
Post:  RN reviewed wet mount with patient. Patient treated for BV and dispensed yeast cream per C. Hampton PA orders. Provider orders complete.   Harvie Heck, RN
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
## 2020-05-18 ENCOUNTER — Encounter: Payer: Self-pay | Admitting: Student

## 2020-05-18 LAB — HM HIV SCREENING LAB: HM HIV Screening: NEGATIVE

## 2020-05-24 ENCOUNTER — Telehealth: Payer: Self-pay | Admitting: Pharmacy Technician

## 2020-05-24 NOTE — Telephone Encounter (Signed)
Patient failed to provide 2021 proof of income.  No additional medication assistance will be provided by [facility] without the required proof of income documentation.  Patient notified by letter.  Caytlyn Evers J. Treniya Lobb Care Manager [facility]   [address] [place], [place]  [address]     This is to inform you that you are no longer eligible to receive medication assistance at [facility].  The reason(s) are:    _____Your total gross monthly household income exceeds 250% of the Federal Poverty Level.   _____Tangible assets (savings, checking, stocks/bonds, pension, retirement, etc.) exceeds our limit  _____You are eligible to receive benefits from Medicaid, [facility] or HIV Medication              Assistance Program _____You are eligible to receive benefits from a Medicare Part "D" plan _____You have prescription insurance  _____You are not an [employer] [place] [place] resident __X__Failure to provide all requested proof of income information for 2021.    Medication assistance will resume once all requested financial information has been returned to our clinic.  If you have questions, please contact our clinic at [phone].    Thank you,  [facility] 

## 2020-09-01 ENCOUNTER — Ambulatory Visit: Payer: Self-pay | Admitting: Family Medicine

## 2020-09-01 ENCOUNTER — Encounter: Payer: Self-pay | Admitting: Family Medicine

## 2020-09-01 ENCOUNTER — Other Ambulatory Visit: Payer: Self-pay

## 2020-09-01 DIAGNOSIS — N760 Acute vaginitis: Secondary | ICD-10-CM

## 2020-09-01 DIAGNOSIS — Z113 Encounter for screening for infections with a predominantly sexual mode of transmission: Secondary | ICD-10-CM

## 2020-09-01 DIAGNOSIS — B9689 Other specified bacterial agents as the cause of diseases classified elsewhere: Secondary | ICD-10-CM

## 2020-09-01 LAB — WET PREP FOR TRICH, YEAST, CLUE
Clue Cell Exam: POSITIVE — AB
Trichomonas Exam: NEGATIVE
Yeast Exam: NEGATIVE

## 2020-09-01 MED ORDER — METRONIDAZOLE 500 MG PO TABS: 500.0000 mg | ORAL_TABLET | Freq: Two times a day (BID) | ORAL | 0 refills | Status: AC
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
## 2020-12-28 ENCOUNTER — Encounter: Payer: Self-pay | Admitting: Family Medicine

## 2020-12-28 ENCOUNTER — Ambulatory Visit: Payer: Self-pay | Admitting: Family Medicine

## 2020-12-28 ENCOUNTER — Other Ambulatory Visit: Payer: Self-pay

## 2020-12-28 DIAGNOSIS — Z113 Encounter for screening for infections with a predominantly sexual mode of transmission: Secondary | ICD-10-CM

## 2020-12-28 DIAGNOSIS — Z299 Encounter for prophylactic measures, unspecified: Secondary | ICD-10-CM

## 2020-12-28 LAB — WET PREP FOR TRICH, YEAST, CLUE
Trichomonas Exam: NEGATIVE
Yeast Exam: NEGATIVE

## 2020-12-28 LAB — HM HIV SCREENING LAB: HM HIV Screening: NEGATIVE

## 2020-12-28 MED ORDER — CLOTRIMAZOLE 1 % VA CREA: 1.0000 | TOPICAL_CREAM | Freq: Every day | VAGINAL | 0 refills | Status: AC
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
## 2021-01-02 LAB — GONOCOCCUS CULTURE

## 2021-03-14 DIAGNOSIS — U071 COVID-19: Secondary | ICD-10-CM

## 2021-03-14 HISTORY — DX: COVID-19: U07.1

## 2021-03-16 ENCOUNTER — Other Ambulatory Visit: Payer: Self-pay

## 2021-03-16 ENCOUNTER — Other Ambulatory Visit: Payer: Self-pay | Admitting: Nurse Practitioner

## 2021-03-16 ENCOUNTER — Ambulatory Visit (INDEPENDENT_AMBULATORY_CARE_PROVIDER_SITE_OTHER): Payer: 59 | Admitting: Nurse Practitioner

## 2021-03-16 ENCOUNTER — Encounter: Payer: Self-pay | Admitting: Nurse Practitioner

## 2021-03-16 VITALS — BP 114/76 | HR 78 | Temp 98.7°F | Ht 63.5 in | Wt 202.4 lb

## 2021-03-16 DIAGNOSIS — L83 Acanthosis nigricans: Secondary | ICD-10-CM | POA: Diagnosis not present

## 2021-03-16 DIAGNOSIS — Z6835 Body mass index (BMI) 35.0-35.9, adult: Secondary | ICD-10-CM

## 2021-03-16 DIAGNOSIS — K219 Gastro-esophageal reflux disease without esophagitis: Secondary | ICD-10-CM

## 2021-03-16 DIAGNOSIS — Z7689 Persons encountering health services in other specified circumstances: Secondary | ICD-10-CM

## 2021-03-16 DIAGNOSIS — D1722 Benign lipomatous neoplasm of skin and subcutaneous tissue of left arm: Secondary | ICD-10-CM

## 2021-03-16 DIAGNOSIS — R0789 Other chest pain: Secondary | ICD-10-CM

## 2021-03-16 DIAGNOSIS — D1721 Benign lipomatous neoplasm of skin and subcutaneous tissue of right arm: Secondary | ICD-10-CM

## 2021-03-16 DIAGNOSIS — E669 Obesity, unspecified: Secondary | ICD-10-CM | POA: Insufficient documentation

## 2021-03-16 DIAGNOSIS — E6609 Other obesity due to excess calories: Secondary | ICD-10-CM

## 2021-03-16 DIAGNOSIS — Z1231 Encounter for screening mammogram for malignant neoplasm of breast: Secondary | ICD-10-CM

## 2021-03-16 DIAGNOSIS — R8281 Pyuria: Secondary | ICD-10-CM

## 2021-03-16 DIAGNOSIS — N898 Other specified noninflammatory disorders of vagina: Secondary | ICD-10-CM

## 2021-03-16 LAB — URINALYSIS, ROUTINE W REFLEX MICROSCOPIC
Bilirubin, UA: NEGATIVE
Glucose, UA: NEGATIVE
Ketones, UA: NEGATIVE
Nitrite, UA: NEGATIVE
Protein,UA: NEGATIVE
RBC, UA: NEGATIVE
Specific Gravity, UA: 1.02 (ref 1.005–1.030)
Urobilinogen, Ur: 0.2 mg/dL (ref 0.2–1.0)
pH, UA: 5.5 (ref 5.0–7.5)

## 2021-03-16 LAB — MICROSCOPIC EXAMINATION: RBC, Urine: NONE SEEN /hpf (ref 0–2)

## 2021-03-16 LAB — WET PREP FOR TRICH, YEAST, CLUE
Clue Cell Exam: NEGATIVE
Trichomonas Exam: NEGATIVE
Yeast Exam: NEGATIVE

## 2021-03-16 MED ORDER — PREDNISONE 10 MG (21) PO TBPK: ORAL_TABLET | ORAL | 0 refills | Status: DC

## 2021-03-16 MED ORDER — FLUCONAZOLE 150 MG PO TABS: 150.0000 mg | ORAL_TABLET | Freq: Once | ORAL | 0 refills | Status: AC

## 2021-03-16 NOTE — Assessment & Plan Note (Signed)
Check A1c today and CMP.

## 2021-03-16 NOTE — Assessment & Plan Note (Signed)
Present for several years and reports is larger -- referral to general surgery placed.

## 2021-03-16 NOTE — Assessment & Plan Note (Signed)
Chronic, ongoing.  Continue Omeprazole, recommend she continue this daily and adjust as needed.  Mag level next visit.

## 2021-03-16 NOTE — Progress Notes (Signed)
New Patient Office Visit  Subjective:  Patient ID: Nancy Mcdowell, female    DOB: July 12, 1965  Age: 55 y.o. MRN: [id number]  CC:  Chief Complaint  Patient presents with   Establish Care    Patient is here to establish care. Patient states she is having a lot of soreness, tingling and aches in her R arm with lifting and moving of the arm. Patient states her daughter says she feels a little knot between her breast area. Patient states she first noticed it on Sunday. Patient denies the increase in size, but states it tender to the touch. Patient denies having any drainage.     HPI MAELEY Mcdowell presents for new patient visit to establish care.  Introduced to Designer, jewellery role and practice setting.  All questions answered.  Discussed provider/patient relationship and expectations.  Has not had a primary care provider in several years -- 7 to 8 years, does not often go to doctor.  Works for [employer] provider in [place].  She does endorse loving wine -- drinks two to three glasses and fruity cocktails on occasion.  No daily alcohol use.   XIPHOID PAIN Reports waking up Sunday morning noticing some discomfort between breasts, xiphoid area, and noticed a knot there.  Last mammogram in 2019.  Family history of breast CA -- two aunts on mother's side, ages 101 and 90's.  Is taking Prilosec 20 MG BID.  Has a lipoma under her left axilla, used to have one right side which she reports is now returning.  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 Duration :days Location: bilateral -midline Onset: sudden Severity: mild soreness Quality: dull and aching Frequency: intermittent Redness: no Swelling: no Trauma: no trauma Breastfeeding: no Associated with menstral cycle: no Nipple discharge: no Breast lump: no Status: stable Treatments attempted: none Previous mammogram: yes in 2019  ARM PAIN (RIGHT) On Friday  her arm was aching, made her anxious.  Hurt all day Friday and eventually calmed down.  Reports this happens to both shoulders sometimes.  Her boss gave her patch recently to place on it and this helped. Duration: days Location: right Mechanism of injury: unknown Onset: gradual Severity: 6/10  Quality:  sharp and tingling sensation shoulder to arm Frequency: intermittent Radiation: no Aggravating factors: movement  Alleviating factors:  a patch her naturopathic provider gave her and rest  Status: stable Treatments attempted: as above  Relief with NSAIDs?:  No NSAIDs Taken Swelling: no Redness: no  Warmth: no Trauma: no Chest pain: no  Shortness of breath: no  Fever: no Decreased sensation: no Paresthesias: no Weakness: no   VAGINAL DISCHARGE Has been present for a length of time -- has used cream and pills.  Keeps recurring.  Duration: weeks Discharge description:  none   Pruritus: yes Dysuria: no Malodorous: no Urinary frequency: no Fevers: no Abdominal pain: no  Sexual activity: occasional -- uses protection History of sexually transmitted diseases: yes -chlamydia Recent antibiotic use: no Context: worse  Treatments attempted: antifungal and vagisil    Past Medical History:  Diagnosis Date   Alcohol abuse (1 bottle wine qoday) 02/15/2020   Anemia 12/25/2016   Cataract    GERD (gastroesophageal reflux disease)    Heart murmur    Said she was told she had one at 88    Past Surgical History:  Procedure Laterality Date   COLONOSCOPY WITH PROPOFOL N/A 02/04/2017  Procedure: COLONOSCOPY WITH PROPOFOL;  Surgeon: [name], MD;  Location[facility]COPY;  Service: Gastroenterology;  Laterality: N/A;   EYE SURGERY Left 2010   retinal detachment   RETINAL DETACHMENT SURGERY Left 2010, 2011   2    Family History  Problem Relation Age of Onset   Hyperlipidemia Mother    Hypertension Mother    Stroke Mother    Arthritis Mother    Kidney disease Mother     Thyroid disease Mother    Kidney disease Father    Heart attack Father    Diabetes Father    Cancer Maternal Aunt        Breast Cancer   Breast cancer Maternal Aunt 18   Breast cancer Maternal Aunt 38   Parkinson's disease Maternal Grandmother    Heart attack Paternal Grandfather    Breast cancer Cousin     Social History   Socioeconomic History   Marital status: Legally Separated    Spouse name: Not on file   Number of children: Not on file   Years of education: Not on file   Highest education level: Not on file  Occupational History   Not on file  Tobacco Use   Smoking status: Never   Smokeless tobacco: Never  Vaping Use   Vaping Use: Never used  Substance and Sexual Activity   Alcohol use: Yes    Alcohol/week: 5.0 standard drinks    Types: 5 Glasses of wine per week    Comment: weekly glass of wine   Drug use: Not Currently    Types: Marijuana    Comment: history of use, last used "8 years ago"   Sexual activity: Yes    Partners: Male    Birth control/protection: Post-menopausal, Condom  Other Topics Concern   Not on file  Social History Narrative   Not on file   Social Determinants of Health   Financial Resource Strain: Low Risk    Difficulty of Paying Living Expenses: Not hard at all  Food Insecurity: No Food Insecurity   Worried About Charity fundraiser in the Last Year: Never true   Ran Out of Food in the Last Year: Never true  Transportation Needs: No Transportation Needs   Lack of Transportation (Medical): No   Lack of Transportation (Non-Medical): No  Physical Activity: Insufficiently Active   Days of Exercise per Week: 2 days   Minutes of Exercise per Session: 30 min  Stress: No Stress Concern Present   Feeling of Stress : Only a little  Social Connections: Moderately Isolated   Frequency of Communication with Friends and Family: More than three times a week   Frequency of Social Gatherings with Friends and Family: More than three times a week    Attends Religious Services: More than 4 times per year   Active Member of [employer] or Organizations: No   Attends Music therapist: Never   Marital Status: Never married  Human resources officer Violence: Not At Risk   Fear of Current or Ex-Partner: No   Emotionally Abused: No   Physically Abused: No   Sexually Abused: No    ROS Review of Systems  Constitutional:  Negative for activity change, appetite change, diaphoresis, fatigue and fever.  Respiratory:  Negative for cough, chest tightness and shortness of breath.   Cardiovascular:  Negative for chest pain, palpitations and leg swelling.  Gastrointestinal: Negative.   Endocrine: Negative for cold intolerance and heat intolerance.  Genitourinary:  Positive for dysuria. Negative for  frequency, hematuria, urgency and vaginal discharge.  Musculoskeletal:  Positive for arthralgias.  Neurological: Negative.   Psychiatric/Behavioral: Negative.     Objective:   Today's Vitals: BP 114/76   Pulse 78   Temp 98.7 F (37.1 C) (Oral)   Ht 5' 3.5" (1.613 m)   Wt 202 lb 6.4 oz (91.8 kg)   LMP  (LMP Unknown) Comment: 3 years ago  SpO2 99%   BMI 35.29 kg/m   Physical Exam Vitals and nursing note reviewed.  Constitutional:      General: She is awake. She is not in acute distress.    Appearance: She is well-developed and well-groomed. She is obese. She is not ill-appearing or toxic-appearing.  HENT:     Head: Normocephalic.     Right Ear: Hearing normal.     Left Ear: Hearing normal.  Eyes:     General: Lids are normal.        Right eye: No discharge.        Left eye: No discharge.     Conjunctiva/sclera: Conjunctivae normal.     Pupils: Pupils are equal, round, and reactive to light.  Neck:     Thyroid: No thyromegaly.     Vascular: No carotid bruit.     Comments: Acanthosis nigricans noted to posterior neck. Cardiovascular:     Rate and Rhythm: Normal rate and regular rhythm.     Heart sounds: Normal heart sounds. No  murmur heard.   No gallop.  Pulmonary:     Effort: Pulmonary effort is normal.     Breath sounds: Normal breath sounds.  Chest:  Breasts:    Right: Normal.     Left: Normal.       Comments: No masses noted. Abdominal:     General: Bowel sounds are normal.     Palpations: Abdomen is soft. There is no hepatomegaly or splenomegaly.  Musculoskeletal:     Right shoulder: Normal.     Left shoulder: Normal.     Right upper arm: Normal.     Left upper arm: Normal.     Right hand: Normal.     Left hand: Normal.     Cervical back: Normal range of motion and neck supple.     Right lower leg: No edema.     Left lower leg: No edema.  Lymphadenopathy:     Head:     Right side of head: No submental, submandibular, tonsillar, preauricular or posterior auricular adenopathy.     Left side of head: No submental, submandibular, tonsillar, preauricular or posterior auricular adenopathy.     Cervical: No cervical adenopathy.     Upper Body:     Right upper body: No supraclavicular, axillary or pectoral adenopathy.     Left upper body: No supraclavicular, axillary or pectoral adenopathy.  Skin:    General: Skin is warm and dry.     Capillary Refill: Capillary refill takes less than 2 seconds.     Comments: Under left lower axilla, moderate size mass noted, soft in consistency, approx 5 cm.  Mobile.  No erythema or tenderness.  No lymphadenopathy noted.  Right upper axilla with similar mass present, smaller in size.  Neurological:     Mental Status: She is alert and oriented to person, place, and time.  Psychiatric:        Attention and Perception: Attention normal.        Mood and Affect: Mood normal.        Behavior: Behavior normal.  Behavior is cooperative.        Thought Content: Thought content normal.        Judgment: Judgment normal.    Assessment & Plan:   Problem List Items Addressed This Visit       Digestive   GERD (gastroesophageal reflux disease) - Primary    Chronic,  ongoing.  Continue Omeprazole, recommend she continue this daily and adjust as needed.  Mag level next visit.      Relevant Orders   Comprehensive metabolic panel   TSH     Musculoskeletal and Integument   Acanthosis nigricans    Check A1c today and CMP.      Relevant Orders   HgB A1c   Xiphoid pain    Acute, ongoing.  ?xiphoidenia.  Will trial a Prednisone taper to see if benefit to pain.  If ongoing then may send to GI as patient has underlying GERD, may need further assessment to see if this is cause for discomfort if ongoing, she is on PPI.  Labs today CBC, CMP, TSH.  Return in 6 weeks.        Other   Lipoma of left axilla    Present for several years and reports is larger -- referral to general surgery placed.      Relevant Orders   CBC with Differential/Platelet   Comprehensive metabolic panel   Ambulatory referral to General Surgery   Lipoma of right axilla    Referral to general surgery placed.      Relevant Orders   Ambulatory referral to General Surgery   Obesity    BMI 35.29.  Recommended eating smaller high protein, low fat meals more frequently and exercising 30 mins a day 5 times a week with a goal of 10-15lb weight loss in the next 3 months. Patient voiced their understanding and motivation to adhere to these recommendations.       Vaginal discharge    Ongoing for several weeks.  Wet prep negative and UA noting many bacteria and leuks.  At this time presentation similar to yeast, will trial one dose of Diflucan to see if benefit to pruritus.  Urine sent for culture to further assess.  If ongoing will perform internal exam next visit to further assess.      Relevant Orders   WET PREP FOR [place], YEAST, CLUE (Completed)   Urinalysis, Routine w reflex microscopic (Completed)   Urine Culture   Other Visit Diagnoses     Pyuria       Send urine for culture.   Relevant Orders   Urine Culture   Encounter for screening mammogram for malignant neoplasm of  breast       Mammogram ordered and discussed with patient.   Relevant Orders   MM 3D SCREEN BREAST BILATERAL   Encounter to establish care           Outpatient Encounter Medications as of 03/16/2021  Medication Sig   fluconazole (DIFLUCAN) 150 MG tablet Take 1 tablet (150 mg total) by mouth once for 1 dose.   omeprazole (PRILOSEC) 20 MG capsule Take 1 capsule (20 mg total) by mouth 2 (two) times daily before a meal.   predniSONE (STERAPRED UNI-PAK 21 TAB) 10 MG (21) TBPK tablet 6 tablets day one, 5 tablets day two,4 tablets day three, 3 tablets day four, 2 tablets day five,1 tablet day 6   [DISCONTINUED] gabapentin (NEURONTIN) 300 MG capsule Take 1 capsule (300 mg total) by mouth at bedtime. (Patient not taking:  No sig reported)   [DISCONTINUED] predniSONE (STERAPRED UNI-PAK 21 TAB) 10 MG (21) TBPK tablet 6 tablets day one, 5 tablets day two,4 tablets day three, 3 tablets day four, 2 tablets day five,1 tablet day 6 (Patient not taking: No sig reported)   No facility-administered encounter medications on file as of 03/16/2021.    Follow-up: Return in about 6 weeks (around 04/27/2021) for Annual physical.   Venita Lick, NP

## 2021-03-16 NOTE — Assessment & Plan Note (Signed)
- 

## 2021-03-16 NOTE — Assessment & Plan Note (Signed)
Ongoing for several weeks.  Wet prep negative and UA noting many bacteria and leuks.  At this time presentation similar to yeast, will trial one dose of Diflucan to see if benefit to pruritus.  Urine sent for culture to further assess.  If ongoing will perform internal exam next visit to further assess.

## 2021-03-16 NOTE — Patient Instructions (Signed)
[facility]  Address: [address] [place], [place], [place] [address]  Phone: [phone]   Lipoma A lipoma is a noncancerous (benign) tumor that is made up of fat cells. This is a very common type of soft-tissue growth. Lipomas are usually found under the skin (subcutaneous). They may occur in any tissue of the body that contains fat. Common areas for lipomas to appear include the back, arms, shoulders, buttocks, and thighs. Lipomas grow slowly, and they are usually painless. Most lipomas do not cause problems and do not require treatment. What are the causes? The cause of this condition is not known. What increases the risk? You are more likely to develop this condition if: You are 60-36 years old. You have a family history of lipomas. What are the signs or symptoms? A lipoma usually appears as a small, round bump under the skin. In most cases, the lump will: Feel soft or rubbery. Not cause pain or other symptoms. However, if a lipoma is located in an area where it pushes on nerves, it can become painful or cause other symptoms. How is this diagnosed? A lipoma can usually be diagnosed with a physical exam. You may also have tests to confirm the diagnosis and to rule out other conditions. Tests may include: Imaging tests, such as a CT scan or an MRI. Removal of a tissue sample to be looked at under a microscope (biopsy). How is this treated? Treatment for this condition depends on the size of the lipoma and whether it is causing any symptoms. For small lipomas that are not causing problems, no treatment is needed. If a lipoma is bigger or it causes problems, surgery may be done to remove the lipoma. Lipomas can also be removed to improve appearance. Most often, the procedure is done after applying a medicine that numbs the area (local anesthetic). Liposuction may be done to reduce the size of the lipoma before it is removed through surgery, or it may be  done to remove the lipoma. Lipomas are removed with this method in order to limit incision size and scarring. A liposuction tube is inserted through a small incision into the lipoma, and the contents of the lipoma are removed through the tube with suction. Follow these instructions at home: Watch your lipoma for any changes. Keep all follow-up visits as told by your health care provider. This is important. Contact a health care provider if: Your lipoma becomes larger or hard. Your lipoma becomes painful, red, or increasingly swollen. These could be signs of infection or a more serious condition. Get help right away if: You develop tingling or numbness in an area near the lipoma. This could indicate that the lipoma is causing nerve damage. Summary A lipoma is a noncancerous tumor that is made up of fat cells. Most lipomas do not cause problems and do not require treatment. If a lipoma is bigger or it causes problems, surgery may be done to remove the lipoma. Contact a health care provider if your lipoma becomes larger or hard, or if it becomes painful, red, or increasingly swollen. Pain, redness, and swelling could be signs of infection or a more serious condition. This information is not intended to replace advice given to you by your health care provider. Make sure you discuss any questions you have with your health care provider. Document Revised: 12/15/2018 Document Reviewed: 12/15/2018 Elsevier Patient Education  [place].

## 2021-03-16 NOTE — Assessment & Plan Note (Signed)
Acute, ongoing.  ?xiphoidenia.  Will trial a Prednisone taper to see if benefit to pain.  If ongoing then may send to GI as patient has underlying GERD, may need further assessment to see if this is cause for discomfort if ongoing, she is on PPI.  Labs today CBC, CMP, TSH.  Return in 6 weeks.

## 2021-03-16 NOTE — Assessment & Plan Note (Signed)
BMI 35.29.  Recommended eating smaller high protein, low fat meals more frequently and exercising 30 mins a day 5 times a week with a goal of 10-15lb weight loss in the next 3 months. Patient voiced their understanding and motivation to adhere to these recommendations.

## 2021-03-17 LAB — COMPREHENSIVE METABOLIC PANEL
ALT: 16 IU/L (ref 0–32)
AST: 20 IU/L (ref 0–40)
Albumin/Globulin Ratio: 1.6 (ref 1.2–2.2)
Albumin: 4.5 g/dL (ref 3.8–4.9)
Alkaline Phosphatase: 96 IU/L (ref 44–121)
BUN/Creatinine Ratio: 12 (ref 9–23)
BUN: 11 mg/dL (ref 6–24)
Bilirubin Total: 0.3 mg/dL (ref 0.0–1.2)
CO2: 23 mmol/L (ref 20–29)
Calcium: 9.5 mg/dL (ref 8.7–10.2)
Chloride: 102 mmol/L (ref 96–106)
Creatinine, Ser: 0.89 mg/dL (ref 0.57–1.00)
Globulin, Total: 2.9 g/dL (ref 1.5–4.5)
Glucose: 102 mg/dL — ABNORMAL HIGH (ref 70–99)
Potassium: 4.3 mmol/L (ref 3.5–5.2)
Sodium: 138 mmol/L (ref 134–144)
Total Protein: 7.4 g/dL (ref 6.0–8.5)
eGFR: 77 mL/min/{1.73_m2} (ref >59)

## 2021-03-17 LAB — CBC WITH DIFFERENTIAL/PLATELET
Basophils Absolute: 0.1 10*3/uL (ref 0.0–0.2)
Basos: 1 %
EOS (ABSOLUTE): 0 10*3/uL (ref 0.0–0.4)
Eos: 1 %
Hematocrit: 38.1 % (ref 34.0–46.6)
Hemoglobin: 12 g/dL (ref 11.1–15.9)
Immature Grans (Abs): 0 10*3/uL (ref 0.0–0.1)
Immature Granulocytes: 0 %
Lymphocytes Absolute: 1.8 10*3/uL (ref 0.7–3.1)
Lymphs: 27 %
MCH: 27.3 pg (ref 26.6–33.0)
MCHC: 31.5 g/dL (ref 31.5–35.7)
MCV: 87 fL (ref 79–97)
Monocytes Absolute: 0.7 10*3/uL (ref 0.1–0.9)
Monocytes: 11 %
Neutrophils Absolute: 4 10*3/uL (ref 1.4–7.0)
Neutrophils: 60 %
Platelets: 204 10*3/uL (ref 150–450)
RBC: 4.39 x10E6/uL (ref 3.77–5.28)
RDW: 12.4 % (ref 11.7–15.4)
WBC: 6.6 10*3/uL (ref 3.4–10.8)

## 2021-03-17 LAB — TSH: TSH: 1.71 u[IU]/mL (ref 0.450–4.500)

## 2021-03-17 LAB — HEMOGLOBIN A1C
Est. average glucose Bld gHb Est-mCnc: 105 mg/dL
Hgb A1c MFr Bld: 5.3 % (ref 4.8–5.6)

## 2021-03-17 NOTE — Progress Notes (Signed)
Contacted via MyChart   Good evening Nachelle, your labs have returned and are overall stable.  CBC shows no anemia or infection.  Kidney function, creatinine and eGFR, is norma, as is liver function, AST and ALT.  Thyroid is normal.  A1c shows no prediabetes or diabetes.  Great news!! Keep being amazing!!  Thank you for allowing me to participate in your care.  I appreciate you. Kindest regards, Fatisha Rabalais

## 2021-03-22 MED ORDER — NITROFURANTOIN MONOHYD MACRO 100 MG PO CAPS: 100.0000 mg | ORAL_CAPSULE | Freq: Two times a day (BID) | ORAL | 0 refills | Status: DC

## 2021-03-22 NOTE — Addendum Note (Signed)
Addended by: Marnee Guarneri T on: 03/22/2021 07:24 PM   Modules accepted: Orders

## 2021-03-22 NOTE — Progress Notes (Signed)
Contacted via MyChart   Good evening Nancy Mcdowell, your urine is showing some growth of bacteria >100,000 in it.  We do treat for this, I am sending in [place] for you to start taking.  If culture returns showing we need a different antibiotic I will alert you and we will change.  Drink lots of water at home.  Any questions? Keep being stellar!!  Thank you for allowing me to participate in your care.  I appreciate you. Kindest regards, Yaqub Arney

## 2021-03-27 ENCOUNTER — Other Ambulatory Visit: Payer: Self-pay | Admitting: Nurse Practitioner

## 2021-03-27 ENCOUNTER — Encounter: Payer: Self-pay | Admitting: Nurse Practitioner

## 2021-03-27 LAB — URINE CULTURE

## 2021-03-27 MED ORDER — AMOXICILLIN-POT CLAVULANATE 875-125 MG PO TABS: 1.0000 | ORAL_TABLET | Freq: Two times a day (BID) | ORAL | 0 refills | Status: AC

## 2021-03-27 NOTE — Progress Notes (Signed)
Contacted via MyChart   Good evening Loyal, your urine did return showing infection is present, but the [place] I sent in does not cover this.  I have sent in Augmentin, this does cover this.  Take this as prescribed for 5 days and if ongoing symptoms once complete let me know.  Any questions? Keep being amazing!!  Thank you for allowing me to participate in your care.  I appreciate you. Kindest regards, Kathelene Rumberger

## 2021-04-03 ENCOUNTER — Encounter: Payer: Self-pay | Admitting: Surgery

## 2021-04-03 ENCOUNTER — Ambulatory Visit (INDEPENDENT_AMBULATORY_CARE_PROVIDER_SITE_OTHER): Payer: 59 | Admitting: Surgery

## 2021-04-03 ENCOUNTER — Other Ambulatory Visit: Payer: Self-pay

## 2021-04-03 VITALS — BP 138/88 | HR 82 | Temp 98.7°F | Ht 65.5 in | Wt 207.2 lb

## 2021-04-03 DIAGNOSIS — D1721 Benign lipomatous neoplasm of skin and subcutaneous tissue of right arm: Secondary | ICD-10-CM | POA: Diagnosis not present

## 2021-04-03 DIAGNOSIS — D1722 Benign lipomatous neoplasm of skin and subcutaneous tissue of left arm: Secondary | ICD-10-CM

## 2021-04-03 NOTE — Progress Notes (Signed)
°04/03/2021 ° °Reason for Visit:  Bilateral axillary lipoma ° °Requesting Provider:  Jolene Cannady, NP ° °History of Present Illness: °Nancy Mcdowell is a 55 y.o. 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  The patient reports that she has had them for many years, and feels that the left one has been present since she was a teenager.  The one of the left has been growing with the years.  Initially she was able to hide the mass somewhat under her bra but now the mass is big enough that she cannot hide it anymore.  She also reports having burning sensation in the axilla and upper inner arm with occasional shooting sensation.  Denies any motor weakness.  On the right side, the lipoma is smaller but she feels that's how the left one started.  She does not want to let this one get as big.  She does mention that at one point she was told it was an extra breast tissue.  She has had mammograms in the past which do not mention either mass and she has not had axillary ultrasound.  Denies any specific pain with either mass, denies any redness of the skin, and denies any fluid drainage.   ° °Of note, her last mammogram was in 2019 which did not show any evidence of malignancy. ° °Past Medical History: °Past Medical History:  °Diagnosis Date  ° Alcohol abuse (1 bottle wine qoday) 02/15/2020  ° Anemia 12/25/2016  ° Cataract   ° GERD (gastroesophageal reflux disease)   ° Heart murmur   ° Said she was told she had one at 20  °  ° °Past Surgical History: °Past Surgical History:  °Procedure Laterality Date  ° COLONOSCOPY WITH PROPOFOL N/A 02/04/2017  ° Procedure: COLONOSCOPY WITH PROPOFOL;  Surgeon: Anna, Kiran, MD;  Location: [facility] ENDOSCOPY;  Service: Gastroenterology;  Laterality: N/A;  ° EYE SURGERY Left 2010  ° retinal detachment  ° RETINAL DETACHMENT SURGERY Left 2010, 2011  ° 2  ° ° °Home Medications: °Prior to Admission medications   °Medication Sig Start Date End Date Taking? Authorizing Provider   °fluconazole (DIFLUCAN) 150 MG tablet Take 150 mg by mouth once. 03/16/21  Yes [provider]  °omeprazole (PRILOSEC) 20 MG capsule Take 1 capsule (20 mg total) by mouth 2 (two) times daily before a meal. 07/16/19  Yes McGowan, Shannon A, PA-C  ° ° °Allergies: °No Known Allergies ° °Social History: ° reports that she has never smoked. She has never used smokeless tobacco. She reports current alcohol use of about 5.0 standard drinks per week. She reports that she does not currently use drugs after having used the following drugs: Marijuana.  ° °Family History: °Family History  °Problem Relation Age of Onset  ° Hyperlipidemia Mother   ° Hypertension Mother   ° Stroke Mother   ° Arthritis Mother   ° Kidney disease Mother   ° Thyroid disease Mother   ° Kidney disease Father   ° Heart attack Father   ° Diabetes Father   ° Cancer Maternal Aunt   °     Breast Cancer  ° Breast cancer Maternal Aunt 50  ° Breast cancer Maternal Aunt 60  ° Parkinson's disease Maternal Grandmother   ° Heart attack Paternal Grandfather   ° Breast cancer Cousin   ° ° °Review of Systems: °Review of Systems  °Constitutional:  Negative for chills and fever.  °HENT:  Negative for hearing loss.   °Respiratory:  Negative for shortness   of breath.   °Cardiovascular:  Negative for chest pain.  °Gastrointestinal:  Negative for nausea and vomiting.  °Genitourinary:  Negative for dysuria.  °Musculoskeletal:  Negative for myalgias.  °Skin:  Negative for rash.  °Neurological:  Negative for dizziness.  °Psychiatric/Behavioral:  Negative for depression.   ° °Physical Exam °BP 138/88    Pulse 82    Temp 98.7 °F (37.1 °C) (Oral)    Ht 5' 5.5" (1.664 m)    Wt 207 lb 3.2 oz (94 kg)    LMP  (LMP Unknown) Comment: 3 years ago   SpO2 98%    BMI 33.96 kg/m²  °CONSTITUTIONAL: No acute distress, well nourished. °HEENT:  Normocephalic, atraumatic, extraocular motion intact. °NECK: Trachea is midline, and there is no jugular venous distension.  °RESPIRATORY:  Lungs  are clear, and breath sounds are equal bilaterally. Normal respiratory effort without pathologic use of accessory muscles. °CARDIOVASCULAR: Heart is regular without murmurs, gallops, or rubs. °BREAST:  Right breast without any palpable masses, skin changes, or nipple changes.  No right axillary lymphadenopathy, but the patient does have a 4 x 8 cm soft tissue mass in the upper axilla, somewhat mobile, non-tender, and very soft.  Left breast without any palpable masses, skin changes, or nipple changes.  No left axillary lymphadenopathy, but the patient does have an 8 x 14 cm soft tissue mass in the upper axilla, somewhat mobile, non-tender, and very soft. °MUSCULOSKELETAL:  Normal muscle strength and tone in all four extremities.  No peripheral edema or cyanosis. °SKIN: Skin turgor is normal. There are no pathologic skin lesions.  °NEUROLOGIC:  Motor and sensation is grossly normal.  Cranial nerves are grossly intact. °PSYCH:  Alert and oriented to person, place and time. Affect is normal. ° °Laboratory Analysis: °Labs from 03/16/21: °Na 138, K 4.3, Cl 102, CO2 23, BUN 11, Cr 0.89.  Total bili 0.3, AST 20, ALT 16, Alk Phos 96, Albumin 4.5.  WBC 6.6, Hgb 12, Hct 38.1, Plt 204.  HgA1c 5.3, TSH 1.71. ° °Imaging: °No results found. ° °Assessment and Plan: °This is a 55 y.o. female with what appears to be bilateral axillary lipomas. ° °--Discussed with the patient that these masses feel soft, are somewhat mobile, and feel like they could be lipomas.  Although she has had prior mammograms, she has not had any true imaging of her axilla.  Her last mammogram was in 2019.  As a precaution, I think we should repeat imaging prior to any surgical intervention and also we'll add bilateral axillary ultrasound to further evaluate the axilla to confirm that these are lipomas rather than additional breast tissue.  I think the discomfort she's having in the axilla is possibly related to irritation of the intercostobrachial  nerve. °--For now, tentatively, will schedule her for excision of bilateral axillary lipomas on 04/20/21 or 04/25/21 depending on OR time availability.  Reviewed with her the surgery at length, including risks of bleeding, infection, injury to surrounding structures, post-op recovery and activity restrictions, and she's willing to proceed. °--Will call patient with imaging results and if there are any different plans prior to surgery. ° °I spent 80 minutes dedicated to the care of this patient on the date of this encounter to include pre-visit review of records, face-to-face time with the patient discussing diagnosis and management, and any post-visit coordination of care. ° ° °Leyan Branden Luis Berry Godsey, MD °[facility] ° °  °

## 2021-04-03 NOTE — Patient Instructions (Addendum)
We will get you scheduled for ultrasounds of the left and right axilla. You will need to have a mammogram first for this to be done. We will call you about this appointment.   __________________________________________________________________________________________  We have spoken to you today about having your lipomas removed in the OR. This will be done at [facility] by Dr Hampton Abbot. You will need to be out of work for a few days and have some lifting restrictions for rest and recovery. If you are taking narcotic pain medication you will not be able to drive.    See your [facility] surgery sheet for information. Our surgery scheduler will call you to look at surgery dates and to go over information.  Lipoma Removal Lipoma removal is a surgical procedure to remove a lipoma, which is a noncancerous (benign) tumor that is made up of fat cells. Most lipomas are small and painless and do not require treatment. They can form in many areas of the body but are most common under the skin of the back, arms, shoulders, buttocks, and thighs. You may need lipoma removal if you have a lipoma that is large, growing, or causing discomfort. Lipoma removal may also be done for cosmetic reasons. Tell a health care provider about: Any allergies you have. All medicines you are taking, including vitamins, herbs, eye drops, creams, and over-the-counter medicines. Any problems you or family members have had with anesthetic medicines. Any blood disorders you have. Any surgeries you have had. Any medical conditions you have. Whether you are pregnant or may be pregnant. What are the risks? Generally, this is a safe procedure. However, problems may occur, including: Infection. Bleeding. Scarring. Allergic reactions to medicines. Damage to nearby structures or organs, such as damage to nerves or blood vessels near the lipoma. What happens before the procedure? Medicines Ask your health care provider about: Changing or stopping  your regular medicines. This is especially important if you are taking diabetes medicines or blood thinners. Taking medicines such as aspirin and ibuprofen. These medicines can thin your blood. Do not take these medicines unless your health care provider tells you to take them. Taking over-the-counter medicines, vitamins, herbs, and supplements. General instructions You will have a physical exam. Your health care provider will check the size of the lipoma and whether it can be moved easily. You may have a biopsy and imaging tests, such as X-rays, a CT scan, and an MRI. Do not use any products that contain nicotine or tobacco for at least 4 weeks before the procedure. These products include cigarettes, e-cigarettes, and chewing tobacco. If you need help quitting, ask your health care provider. Ask your health care provider: How your surgery site will be marked. What steps will be taken to help prevent infection. These may include: Washing skin with a germ-killing soap. Taking antibiotic medicine. Plan to have someone take you home from the hospital or clinic. If you will be going home right after the procedure, plan to have someone with you for 24 hours. What happens during the procedure?  An IV will be inserted into one of your veins. You will be given one or more of the following: A medicine to help you relax (sedative). A medicine to numb the area (local anesthetic). A medicine to make you fall asleep (general anesthetic). A medicine that is injected into an area of your body to numb everything below the injection site (regional anesthetic). An incision will be made over the lipoma or very near the lipoma. The incision  may be made in a natural skin line or crease. Tissues, nerves, and blood vessels near the lipoma will be moved out of the way. The lipoma and the capsule that surrounds it will be separated from the surrounding tissues. The lipoma will be removed. The incision may be closed  with stitches (sutures). A bandage (dressing) will be placed over the incision. The procedure may vary among health care providers and hospitals. What happens after the procedure? Your blood pressure, heart rate, breathing rate, and blood oxygen level will be monitored until you leave the hospital or clinic. If you were prescribed an antibiotic medicine, use it as told by your health care provider. Do not stop using the antibiotic even if you start to feel better. If you were given a sedative during the procedure, it can affect you for several hours. Do not drive or operate machinery until your health care provider says that it is safe. Summary Before the procedure, follow instructions from your health care provider about eating and drinking, and changing or stopping your regular medicines. This is especially important if you are taking diabetes medicines or blood thinners. After the lipoma is removed, the incision may be closed with stitches (sutures) and covered with a bandage (dressing). If you were given a sedative during the procedure, it can affect you for several hours. Do not drive or operate machinery until your health care provider says that it is safe. This information is not intended to replace advice given to you by your health care provider. Make sure you discuss any questions you have with your health care provider. Document Revised: 12/15/2018 Document Reviewed: 12/15/2018 Elsevier Patient Education  [place].

## 2021-04-03 NOTE — H&P (View-Only) (Signed)
04/03/2021  Reason for Visit:  Bilateral axillary lipoma  Requesting Provider:  Marnee Guarneri, NP  History of Present Illness: Nancy Mcdowell is a 55 y.o. 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  The patient reports that she has had them for many years, and feels that the left one has been present since she was a teenager.  The one of the left has been growing with the years.  Initially she was able to hide the mass somewhat under her bra but now the mass is big enough that she cannot hide it anymore.  She also reports having burning sensation in the axilla and upper inner arm with occasional shooting sensation.  Denies any motor weakness.  On the right side, the lipoma is smaller but she feels that's how the left one started.  She does not want to let this one get as big.  She does mention that at one point she was told it was an extra breast tissue.  She has had mammograms in the past which do not mention either mass and she has not had axillary ultrasound.  Denies any specific pain with either mass, denies any redness of the skin, and denies any fluid drainage.    Of note, her last mammogram was in 2019 which did not show any evidence of malignancy.  Past Medical History: Past Medical History:  Diagnosis Date   Alcohol abuse (1 bottle wine qoday) 02/15/2020   Anemia 12/25/2016   Cataract    GERD (gastroesophageal reflux disease)    Heart murmur    Said she was told she had one at 23     Past Surgical History: Past Surgical History:  Procedure Laterality Date   COLONOSCOPY WITH PROPOFOL N/A 02/04/2017   Procedure: COLONOSCOPY WITH PROPOFOL;  Surgeon: [name], MD;  Location: [facility] ENDOSCOPY;  Service: Gastroenterology;  Laterality: N/A;   EYE SURGERY Left 2010   retinal detachment   RETINAL DETACHMENT SURGERY Left 2010, 2011   2    Home Medications: Prior to Admission medications   Medication Sig Start Date End Date Taking? Authorizing Provider   fluconazole (DIFLUCAN) 150 MG tablet Take 150 mg by mouth once. 03/16/21  Yes [provider]  omeprazole (PRILOSEC) 20 MG capsule Take 1 capsule (20 mg total) by mouth 2 (two) times daily before a meal. 07/16/19  Yes McGowan, Larene [place] A, PA-C    Allergies: No Known Allergies  Social History:  reports that she has never smoked. She has never used smokeless tobacco. She reports current alcohol use of about 5.0 standard drinks per week. She reports that she does not currently use drugs after having used the following drugs: Marijuana.   Family History: Family History  Problem Relation Age of Onset   Hyperlipidemia Mother    Hypertension Mother    Stroke Mother    Arthritis Mother    Kidney disease Mother    Thyroid disease Mother    Kidney disease Father    Heart attack Father    Diabetes Father    Cancer Maternal Aunt        Breast Cancer   Breast cancer Maternal Aunt 35   Breast cancer Maternal Aunt 74   Parkinson's disease Maternal Grandmother    Heart attack Paternal Grandfather    Breast cancer Cousin     Review of Systems: Review of Systems  Constitutional:  Negative for chills and fever.  HENT:  Negative for hearing loss.   Respiratory:  Negative for shortness  of breath.   Cardiovascular:  Negative for chest pain.  Gastrointestinal:  Negative for nausea and vomiting.  Genitourinary:  Negative for dysuria.  Musculoskeletal:  Negative for myalgias.  Skin:  Negative for rash.  Neurological:  Negative for dizziness.  Psychiatric/Behavioral:  Negative for depression.    Physical Exam BP 138/88    Pulse 82    Temp 98.7 F (37.1 C) (Oral)    Ht 5' 5.5" (1.664 m)    Wt 207 lb 3.2 oz (94 kg)    LMP  (LMP Unknown) Comment: 3 years ago   SpO2 98%    BMI 33.96 kg/m  CONSTITUTIONAL: No acute distress, well nourished. HEENT:  Normocephalic, atraumatic, extraocular motion intact. NECK: Trachea is midline, and there is no jugular venous distension.  RESPIRATORY:  Lungs  are clear, and breath sounds are equal bilaterally. Normal respiratory effort without pathologic use of accessory muscles. CARDIOVASCULAR: Heart is regular without murmurs, gallops, or rubs. BREAST:  Right breast without any palpable masses, skin changes, or nipple changes.  No right axillary lymphadenopathy, but the patient does have a 4 x 8 cm soft tissue mass in the upper axilla, somewhat mobile, non-tender, and very soft.  Left breast without any palpable masses, skin changes, or nipple changes.  No left axillary lymphadenopathy, but the patient does have an 8 x 14 cm soft tissue mass in the upper axilla, somewhat mobile, non-tender, and very soft. MUSCULOSKELETAL:  Normal muscle strength and tone in all four extremities.  No peripheral edema or cyanosis. SKIN: Skin turgor is normal. There are no pathologic skin lesions.  NEUROLOGIC:  Motor and sensation is grossly normal.  Cranial nerves are grossly intact. PSYCH:  Alert and oriented to person, place and time. Affect is normal.  Laboratory Analysis: Labs from 03/16/21: Na 138, K 4.3, Cl 102, CO2 23, BUN 11, Cr 0.89.  Total bili 0.3, AST 20, ALT 16, Alk Phos 96, Albumin 4.5.  WBC 6.6, Hgb 12, Hct 38.1, Plt 204.  HgA1c 5.3, TSH 1.71.  Imaging: No results found.  Assessment and Plan: This is a 55 y.o. female with what appears to be bilateral axillary lipomas.  --Discussed with the patient that these masses feel soft, are somewhat mobile, and feel like they could be lipomas.  Although she has had prior mammograms, she has not had any true imaging of her axilla.  Her last mammogram was in 2019.  As a precaution, I think we should repeat imaging prior to any surgical intervention and also we'll add bilateral axillary ultrasound to further evaluate the axilla to confirm that these are lipomas rather than additional breast tissue.  I think the discomfort she's having in the axilla is possibly related to irritation of the intercostobrachial  nerve. --For now, tentatively, will schedule her for excision of bilateral axillary lipomas on 04/20/21 or 04/25/21 depending on OR time availability.  Reviewed with her the surgery at length, including risks of bleeding, infection, injury to surrounding structures, post-op recovery and activity restrictions, and she's willing to proceed. --Will call patient with imaging results and if there are any different plans prior to surgery.  I spent 80 minutes dedicated to the care of this patient on the date of this encounter to include pre-visit review of records, face-to-face time with the patient discussing diagnosis and management, and any post-visit coordination of care.   Melvyn Neth, [facility]

## 2021-04-04 ENCOUNTER — Telehealth: Payer: Self-pay

## 2021-04-04 NOTE — Telephone Encounter (Signed)
Call to patient to let her know about her Mammogram and axillary Ultrasounds. The patient is scheduled for this at the [facility] on 04/18/21 at 10:20 am. She is currently scheduled for surgery on 04/25/21.

## 2021-04-05 ENCOUNTER — Telehealth: Payer: Self-pay | Admitting: Surgery

## 2021-04-05 NOTE — Telephone Encounter (Signed)
When calling the patient to give her surgery information, it was explained that we had to schedule on 04/25/21 and at this time that 12/8 OR time not available at this time.  Patient states that 04/25/21 actually works better for her as this will allow her more time to get things ready with her job.    Patient has been advised of Pre-Admission date/time, COVID Testing date and Surgery date.  Surgery Date: 04/25/21 Preadmission Testing Date: 04/17/21 (phone 8a-1:00 pm) Covid Testing Date: Not needed.    Patient has been made aware to call [phone], between 1-3:00pm the day before surgery, to find out what time to arrive for surgery.

## 2021-04-17 ENCOUNTER — Encounter
Admission: RE | Admit: 2021-04-17 | Discharge: 2021-04-17 | Disposition: A | Discharge status: Home / Self Care | Payer: 59 | Source: Ambulatory Visit | Attending: Surgery | Admitting: Surgery

## 2021-04-17 ENCOUNTER — Other Ambulatory Visit: Payer: Self-pay

## 2021-04-17 HISTORY — DX: Sleep apnea, unspecified: G47.30

## 2021-04-17 NOTE — Patient Instructions (Signed)
Your procedure is scheduled on:04-25-21 Tuesday Report to the Registration Desk on the 1st floor of the [facility]. To find out your arrival time, please call [phone] between 1PM - 3PM on:04-24-21 Monday  REMEMBER: Instructions that are not followed completely may result in serious medical risk, up to and including death; or upon the discretion of your surgeon and anesthesiologist your surgery may need to be rescheduled.  Do not eat food after midnight the night before surgery.  No gum chewing, lozengers or hard candies.  You may however, drink CLEAR liquids up to 2 hours before you are scheduled to arrive for your surgery. Do not drink anything within 2 hours of your scheduled arrival time.  Clear liquids include: - water  - apple juice without pulp - gatorade (not RED, PURPLE, OR BLUE) - black coffee or tea (Do NOT add milk or creamers to the coffee or tea) Do NOT drink anything that is not on this list  TAKE THESE MEDICATIONS THE MORNING OF SURGERY WITH A SIP OF WATER: -pantoprazole (PROTONIX) 40 MG tablet-take one the night before and one on the morning of surgery - helps to prevent nausea after surgery.)  One week prior to surgery: Stop Anti-inflammatories (NSAIDS) such as Advil, Aleve, Ibuprofen, Motrin, Naproxen, Naprosyn and Aspirin based products such as Excedrin, Goodys Powder, BC Powder.You may however, take Tylenol if needed for pain up until the day of surgery.  Stop ANY OVER THE COUNTER supplements/vitamins NOW (04-17-21) until after surgery.  No Alcohol for 24 hours before or after surgery.  No Smoking including e-cigarettes for 24 hours prior to surgery.  No chewable tobacco products for at least 6 hours prior to surgery.  No nicotine patches on the day of surgery.  Do not use any "recreational" drugs for at least a week prior to your surgery.  Please be advised that the combination of cocaine and anesthesia may have negative outcomes, up to and including  death. If you test positive for cocaine, your surgery will be cancelled.  On the morning of surgery brush your teeth with toothpaste and water, you may rinse your mouth with mouthwash if you wish. Do not swallow any toothpaste or mouthwash.  Use CHG Soap as directed on instruction sheet.  Do not wear jewelry, make-up, hairpins, clips or nail polish.  Do not wear lotions, powders, or perfumes.   Do not shave body from the neck down 48 hours prior to surgery just in case you cut yourself which could leave a site for infection.  Also, freshly shaved skin may become irritated if using the CHG soap.  Contact lenses, hearing aids and dentures may not be worn into surgery.  Do not bring valuables to the hospital. [facility] is not responsible for any missing/lost belongings or valuables.  Notify your doctor if there is any change in your medical condition (cold, fever, infection).  Wear comfortable clothing (specific to your surgery type) to the hospital.  After surgery, you can help prevent lung complications by doing breathing exercises.  Take deep breaths and cough every 1-2 hours. Your doctor may order a device called an Incentive Spirometer to help you take deep breaths. When coughing or sneezing, hold a pillow firmly against your incision with both hands. This is called "splinting." Doing this helps protect your incision. It also decreases belly discomfort.  If you are being admitted to the hospital overnight, leave your suitcase in the car. After surgery it may be brought to your room.  If  you are being discharged the day of surgery, you will not be allowed to drive home. You will need a responsible adult (18 years or older) to drive you home and stay with you that night.   If you are taking public transportation, you will need to have a responsible adult (18 years or older) with you. Please confirm with your physician that it is acceptable to use public transportation.   Please  call the [facility] at [phone] if you have any questions about these instructions.  Surgery Visitation Policy:  Patients undergoing a surgery or procedure may have one family member or support person with them as long as that person is not COVID-19 positive or experiencing its symptoms.  That person may remain in the waiting area during the procedure and may rotate out with other people.  Inpatient Visitation:    Visiting hours are 7 a.m. to 8 p.m. Up to two visitors ages 16+ are allowed at one time in a patient room. The visitors may rotate out with other people during the day. Visitors must check out when they leave, or other visitors will not be allowed. One designated support person may remain overnight. The visitor must pass COVID-19 screenings, use hand sanitizer when entering and exiting the patient's room and wear a mask at all times, including in the patient's room. Patients must also wear a mask when staff or their visitor are in the room. Masking is required regardless of vaccination status.

## 2021-04-18 ENCOUNTER — Other Ambulatory Visit: Payer: Self-pay | Admitting: Surgery

## 2021-04-18 ENCOUNTER — Ambulatory Visit
Admission: RE | Admit: 2021-04-18 | Discharge: 2021-04-18 | Disposition: A | Discharge status: Home / Self Care | Payer: 59 | Source: Ambulatory Visit | Attending: Surgery | Admitting: Surgery

## 2021-04-18 DIAGNOSIS — D1722 Benign lipomatous neoplasm of skin and subcutaneous tissue of left arm: Secondary | ICD-10-CM

## 2021-04-18 DIAGNOSIS — D1721 Benign lipomatous neoplasm of skin and subcutaneous tissue of right arm: Secondary | ICD-10-CM

## 2021-04-18 NOTE — Progress Notes (Signed)
04/18/21  Mammogram and ultrasound personally viewed.  No evidence of malignancy or lymphadenopathy.  Both axillary areas contain prominent fat pad vs lipomas.  OK to proceed with excision as currently planned on 12/13.

## 2021-04-18 NOTE — H&P (View-Only) (Signed)
04/18/21  Mammogram and ultrasound personally viewed.  No evidence of malignancy or lymphadenopathy.  Both axillary areas contain prominent fat pad vs lipomas.  OK to proceed with excision as currently planned on 12/13.

## 2021-04-25 ENCOUNTER — Other Ambulatory Visit: Payer: Self-pay

## 2021-04-25 ENCOUNTER — Encounter
Admission: RE | Disposition: A | Discharge status: Home / Self Care | Payer: Self-pay | Source: Ambulatory Visit | Attending: Surgery

## 2021-04-25 ENCOUNTER — Encounter: Payer: Self-pay | Admitting: Surgery

## 2021-04-25 ENCOUNTER — Ambulatory Visit: Payer: 59 | Admitting: Anesthesiology

## 2021-04-25 ENCOUNTER — Ambulatory Visit
Admission: RE | Admit: 2021-04-25 | Discharge: 2021-04-25 | Disposition: A | Discharge status: Home / Self Care | Payer: 59 | Source: Ambulatory Visit | Attending: Surgery | Admitting: Surgery

## 2021-04-25 DIAGNOSIS — D759 Disease of blood and blood-forming organs, unspecified: Secondary | ICD-10-CM | POA: Diagnosis not present

## 2021-04-25 DIAGNOSIS — R059 Cough, unspecified: Secondary | ICD-10-CM | POA: Insufficient documentation

## 2021-04-25 DIAGNOSIS — D1722 Benign lipomatous neoplasm of skin and subcutaneous tissue of left arm: Secondary | ICD-10-CM | POA: Diagnosis not present

## 2021-04-25 DIAGNOSIS — Z8616 Personal history of COVID-19: Secondary | ICD-10-CM | POA: Diagnosis not present

## 2021-04-25 DIAGNOSIS — D649 Anemia, unspecified: Secondary | ICD-10-CM | POA: Insufficient documentation

## 2021-04-25 DIAGNOSIS — E65 Localized adiposity: Secondary | ICD-10-CM | POA: Diagnosis present

## 2021-04-25 DIAGNOSIS — R011 Cardiac murmur, unspecified: Secondary | ICD-10-CM | POA: Diagnosis not present

## 2021-04-25 DIAGNOSIS — G4730 Sleep apnea, unspecified: Secondary | ICD-10-CM | POA: Insufficient documentation

## 2021-04-25 DIAGNOSIS — D1721 Benign lipomatous neoplasm of skin and subcutaneous tissue of right arm: Secondary | ICD-10-CM

## 2021-04-25 DIAGNOSIS — K219 Gastro-esophageal reflux disease without esophagitis: Secondary | ICD-10-CM | POA: Insufficient documentation

## 2021-04-25 HISTORY — PX: LIPOMA EXCISION: SHX5283

## 2021-04-25 SURGERY — EXCISION LIPOMA
Anesthesia: General | Site: Axilla | Laterality: Bilateral

## 2021-04-25 MED ORDER — 0.9 % SODIUM CHLORIDE (POUR BTL) OPTIME
TOPICAL | Status: DC | PRN
  Administered 2021-04-25: 500 mL

## 2021-04-25 MED ORDER — ACETAMINOPHEN 500 MG PO TABS
1000.0000 mg | ORAL_TABLET | ORAL | Status: AC
  Administered 2021-04-25: 1000 mg via ORAL

## 2021-04-25 MED ORDER — BUPIVACAINE LIPOSOME 1.3 % IJ SUSP: 20.0000 mL | Freq: Once | INTRAMUSCULAR | Status: DC

## 2021-04-25 MED ORDER — GLYCOPYRROLATE 0.2 MG/ML IJ SOLN
INTRAMUSCULAR | Status: DC | PRN
  Administered 2021-04-25: .2 mg via INTRAVENOUS

## 2021-04-25 MED ORDER — CHLORHEXIDINE GLUCONATE 0.12 % MT SOLN: 15.0000 mL | Freq: Once | OROMUCOSAL | Status: AC

## 2021-04-25 MED ORDER — OXYCODONE HCL 5 MG PO TABS: 5.0000 mg | ORAL_TABLET | ORAL | 0 refills | Status: DC | PRN

## 2021-04-25 MED ORDER — PROMETHAZINE HCL 25 MG/ML IJ SOLN: 6.2500 mg | INTRAMUSCULAR | Status: DC | PRN

## 2021-04-25 MED ORDER — ORAL CARE MOUTH RINSE: 15.0000 mL | Freq: Once | OROMUCOSAL | Status: AC

## 2021-04-25 MED ORDER — CEFAZOLIN SODIUM-DEXTROSE 2-4 GM/100ML-% IV SOLN
INTRAVENOUS | Status: AC
  Filled 2021-04-25: qty 100

## 2021-04-25 MED ORDER — MIDAZOLAM HCL 2 MG/2ML IJ SOLN
INTRAMUSCULAR | Status: AC
  Filled 2021-04-25: qty 2

## 2021-04-25 MED ORDER — FENTANYL CITRATE (PF) 100 MCG/2ML IJ SOLN
25.0000 ug | INTRAMUSCULAR | Status: AC | PRN
  Administered 2021-04-25 (×4): 25 ug via INTRAVENOUS

## 2021-04-25 MED ORDER — LIDOCAINE HCL (CARDIAC) PF 100 MG/5ML IV SOSY
PREFILLED_SYRINGE | INTRAVENOUS | Status: DC | PRN
  Administered 2021-04-25: 100 mg via INTRAVENOUS

## 2021-04-25 MED ORDER — ACETAMINOPHEN 500 MG PO TABS
ORAL_TABLET | ORAL | Status: AC
  Filled 2021-04-25: qty 2

## 2021-04-25 MED ORDER — GABAPENTIN 300 MG PO CAPS
300.0000 mg | ORAL_CAPSULE | ORAL | Status: AC
  Administered 2021-04-25: 300 mg via ORAL

## 2021-04-25 MED ORDER — IBUPROFEN 600 MG PO TABS: 600.0000 mg | ORAL_TABLET | Freq: Three times a day (TID) | ORAL | 1 refills | Status: AC | PRN

## 2021-04-25 MED ORDER — FENTANYL CITRATE (PF) 100 MCG/2ML IJ SOLN
INTRAMUSCULAR | Status: DC | PRN
  Administered 2021-04-25: 50 ug via INTRAVENOUS

## 2021-04-25 MED ORDER — GABAPENTIN 300 MG PO CAPS
ORAL_CAPSULE | ORAL | Status: AC
  Filled 2021-04-25: qty 1

## 2021-04-25 MED ORDER — ACETAMINOPHEN 500 MG PO TABS: 1000.0000 mg | ORAL_TABLET | Freq: Four times a day (QID) | ORAL | Status: AC | PRN

## 2021-04-25 MED ORDER — PHENYLEPHRINE 40 MCG/ML (10ML) SYRINGE FOR IV PUSH (FOR BLOOD PRESSURE SUPPORT)
PREFILLED_SYRINGE | INTRAVENOUS | Status: DC | PRN
  Administered 2021-04-25 (×2): 80 ug via INTRAVENOUS

## 2021-04-25 MED ORDER — PROPOFOL 10 MG/ML IV BOLUS
INTRAVENOUS | Status: AC
  Filled 2021-04-25: qty 20

## 2021-04-25 MED ORDER — FENTANYL CITRATE (PF) 100 MCG/2ML IJ SOLN
INTRAMUSCULAR | Status: AC
  Administered 2021-04-25: 25 ug via INTRAVENOUS
  Filled 2021-04-25: qty 2

## 2021-04-25 MED ORDER — ROCURONIUM BROMIDE 100 MG/10ML IV SOLN
INTRAVENOUS | Status: DC | PRN
  Administered 2021-04-25: 10 mg via INTRAVENOUS

## 2021-04-25 MED ORDER — FENTANYL CITRATE (PF) 100 MCG/2ML IJ SOLN
INTRAMUSCULAR | Status: AC
  Filled 2021-04-25: qty 2

## 2021-04-25 MED ORDER — SUCCINYLCHOLINE CHLORIDE 200 MG/10ML IV SOSY
PREFILLED_SYRINGE | INTRAVENOUS | Status: DC | PRN
  Administered 2021-04-25: 50 mg via INTRAVENOUS
  Administered 2021-04-25: 100 mg via INTRAVENOUS

## 2021-04-25 MED ORDER — BUPIVACAINE LIPOSOME 1.3 % IJ SUSP
INTRAMUSCULAR | Status: AC
  Filled 2021-04-25: qty 20

## 2021-04-25 MED ORDER — PROPOFOL 10 MG/ML IV BOLUS
INTRAVENOUS | Status: DC | PRN
  Administered 2021-04-25: 200 mg via INTRAVENOUS

## 2021-04-25 MED ORDER — ONDANSETRON HCL 4 MG/2ML IJ SOLN
INTRAMUSCULAR | Status: DC | PRN
  Administered 2021-04-25 (×2): 4 mg via INTRAVENOUS

## 2021-04-25 MED ORDER — BUPIVACAINE LIPOSOME 1.3 % IJ SUSP
INTRAMUSCULAR | Status: AC
  Filled 2021-04-25: qty 10

## 2021-04-25 MED ORDER — BUPIVACAINE HCL (PF) 0.5 % IJ SOLN
INTRAMUSCULAR | Status: AC
  Filled 2021-04-25: qty 30

## 2021-04-25 MED ORDER — CHLORHEXIDINE GLUCONATE CLOTH 2 % EX PADS: 6.0000 | MEDICATED_PAD | Freq: Once | CUTANEOUS | Status: DC

## 2021-04-25 MED ORDER — LACTATED RINGERS IV SOLN: INTRAVENOUS | Status: DC

## 2021-04-25 MED ORDER — DEXAMETHASONE SODIUM PHOSPHATE 10 MG/ML IJ SOLN
INTRAMUSCULAR | Status: DC | PRN
  Administered 2021-04-25: 10 mg via INTRAVENOUS

## 2021-04-25 MED ORDER — CHLORHEXIDINE GLUCONATE 0.12 % MT SOLN
OROMUCOSAL | Status: AC
  Administered 2021-04-25: 15 mL via OROMUCOSAL
  Filled 2021-04-25: qty 15

## 2021-04-25 MED ORDER — BUPIVACAINE LIPOSOME 1.3 % IJ SUSP: INTRAMUSCULAR | Status: DC | PRN

## 2021-04-25 MED ORDER — CEFAZOLIN SODIUM-DEXTROSE 2-4 GM/100ML-% IV SOLN
2.0000 g | INTRAVENOUS | Status: AC
  Administered 2021-04-25: 2 g via INTRAVENOUS

## 2021-04-25 MED ORDER — MIDAZOLAM HCL 2 MG/2ML IJ SOLN
INTRAMUSCULAR | Status: DC | PRN
  Administered 2021-04-25: 2 mg via INTRAVENOUS

## 2021-04-25 MED ORDER — SUGAMMADEX SODIUM 500 MG/5ML IV SOLN
INTRAVENOUS | Status: DC | PRN
  Administered 2021-04-25: 500 mg via INTRAVENOUS

## 2021-04-25 MED ORDER — BUPIVACAINE LIPOSOME 1.3 % IJ SUSP
INTRAMUSCULAR | Status: DC | PRN
  Administered 2021-04-25: 25 mL via SURGICAL_CAVITY
  Administered 2021-04-25: 15 mL via SURGICAL_CAVITY

## 2021-04-25 MED ORDER — PROPOFOL 500 MG/50ML IV EMUL
INTRAVENOUS | Status: AC
  Filled 2021-04-25: qty 50

## 2021-04-25 SURGICAL SUPPLY — 38 items
ADH SKN CLS APL DERMABOND .7 (GAUZE/BANDAGES/DRESSINGS) ×2
APL PRP STRL LF DISP 70% ISPRP (MISCELLANEOUS) ×1
BLADE SURG 15 STRL LF DISP TIS (BLADE) IMPLANT
BLADE SURG 15 STRL SS (BLADE) ×3
CHLORAPREP W/TINT 26 (MISCELLANEOUS) ×3 IMPLANT
DERMABOND ADVANCED (GAUZE/BANDAGES/DRESSINGS) ×4
DERMABOND ADVANCED .7 DNX12 (GAUZE/BANDAGES/DRESSINGS) ×1 IMPLANT
DRAPE 3/4 80X56 (DRAPES) ×6 IMPLANT
DRAPE LAPAROTOMY 100X77 ABD (DRAPES) ×3 IMPLANT
DRSG TEGADERM 6X8 (GAUZE/BANDAGES/DRESSINGS) ×2 IMPLANT
ELECT CAUTERY BLADE TIP 2.5 (TIP) ×3
ELECT REM PT RETURN 9FT ADLT (ELECTROSURGICAL) ×3
ELECTRODE CAUTERY BLDE TIP 2.5 (TIP) ×1 IMPLANT
ELECTRODE REM PT RTRN 9FT ADLT (ELECTROSURGICAL) ×1 IMPLANT
GAUZE 4X4 16PLY ~~LOC~~+RFID DBL (SPONGE) ×3 IMPLANT
GLOVE SURG SYN 7.0 (GLOVE) ×3 IMPLANT
GLOVE SURG SYN 7.0 PF PI (GLOVE) ×1 IMPLANT
GLOVE SURG SYN 7.5  E (GLOVE) ×2
GLOVE SURG SYN 7.5 E (GLOVE) ×1 IMPLANT
GLOVE SURG SYN 7.5 PF PI (GLOVE) ×1 IMPLANT
GOWN STRL REUS W/ TWL LRG LVL3 (GOWN DISPOSABLE) ×2 IMPLANT
GOWN STRL REUS W/TWL LRG LVL3 (GOWN DISPOSABLE) ×6
KIT TURNOVER KIT A (KITS) ×3 IMPLANT
LABEL OR SOLS (LABEL) ×3 IMPLANT
MANIFOLD NEPTUNE II (INSTRUMENTS) ×3 IMPLANT
NEEDLE HYPO 22GX1.5 SAFETY (NEEDLE) ×3 IMPLANT
NS IRRIG 1000ML POUR BTL (IV SOLUTION) ×3 IMPLANT
PACK BASIN MINOR ARMC (MISCELLANEOUS) ×3 IMPLANT
SUT MNCRL 4-0 (SUTURE) ×6
SUT MNCRL 4-0 27XMFL (SUTURE) ×2
SUT VIC AB 0 SH 27 (SUTURE) ×6 IMPLANT
SUT VIC AB 2-0 SH 27 (SUTURE) ×6
SUT VIC AB 2-0 SH 27XBRD (SUTURE) IMPLANT
SUT VIC AB 3-0 SH 27 (SUTURE) ×6
SUT VIC AB 3-0 SH 27X BRD (SUTURE) ×2 IMPLANT
SUTURE MNCRL 4-0 27XMF (SUTURE) ×1 IMPLANT
SYR 30ML LL (SYRINGE) ×3 IMPLANT
WATER STERILE IRR 500ML POUR (IV SOLUTION) ×3 IMPLANT

## 2021-04-25 NOTE — Anesthesia Preprocedure Evaluation (Signed)
Anesthesia Evaluation  Patient identified by MRN, date of birth, ID band Patient awake    Reviewed: Allergy & Precautions, H&P , NPO status , Patient's Chart, lab work & pertinent test results, reviewed documented beta blocker date and time   History of Anesthesia Complications Negative for: history of anesthetic complications  Airway Mallampati: II   Neck ROM: full    Dental  (+) Poor Dentition, Dental Advidsory Given   Pulmonary neg shortness of breath, sleep apnea (mild, no CPAP) , neg COPD, Recent URI  (Covid 2 weeks ago), Residual Cough,    Pulmonary exam normal        Cardiovascular Exercise Tolerance: Good (-) hypertension(-) angina(-) Past MI and (-) Cardiac Stents Normal cardiovascular exam(-) dysrhythmias + Valvular Problems/Murmurs  Rhythm:regular Rate:Normal     Neuro/Psych negative neurological ROS  negative psych ROS   GI/Hepatic negative GI ROS, Neg liver ROS, GERD  Medicated,  Endo/Other  negative endocrine ROS  Renal/GU negative Renal ROS  negative genitourinary   Musculoskeletal   Abdominal   Peds  Hematology negative hematology ROS (+) Blood dyscrasia, anemia ,   Anesthesia Other Findings Past Medical History: 12/25/2016: Anemia No date: Cataract No date: GERD (gastroesophageal reflux disease) No date: Heart murmur     Comment:  Nancy Mcdowell she was told she had one at 24 Past Surgical History: 2010, 2011: [place]; Left     Comment:  2   Reproductive/Obstetrics negative OB ROS                             Anesthesia Physical  Anesthesia Plan  ASA: 3  Anesthesia Plan: General   Post-op Pain Management:    Induction: Intravenous  PONV Risk Score and Plan: 3 and Ondansetron, Dexamethasone, Midazolam and Treatment may vary due to age or medical condition  Airway Management Planned: LMA  Additional Equipment:   Intra-op Plan:   Post-operative  Plan: Extubation in OR  Informed Consent: I have reviewed the patients History and Physical, chart, labs and discussed the procedure including the risks, benefits and alternatives for the proposed anesthesia with the patient or authorized representative who has indicated his/her understanding and acceptance.     Dental Advisory Given  Plan Discussed with: CRNA  Anesthesia Plan Comments:         Anesthesia Quick Evaluation

## 2021-04-25 NOTE — Transfer of Care (Signed)
Immediate Anesthesia Transfer of Care Note  Patient: Nancy Mcdowell  Procedure(s) Performed: EXCISION LIPOMA, bilateral axillary lipomas (Bilateral: Axilla)  Patient Location: PACU  Anesthesia Type:General  Level of Consciousness: awake, drowsy and patient cooperative  Airway & Oxygen Therapy: Patient Spontanous Breathing and Patient connected to face mask oxygen  Post-op Assessment: Report given to RN and Post -op Vital signs reviewed and stable  Post vital signs: Reviewed and stable  Last Vitals:  Vitals Value Taken Time  BP    Temp    Pulse 82 04/25/21 1602  Resp 11 04/25/21 1602  SpO2 100 % 04/25/21 1602  Vitals shown include unvalidated device data.  Last Pain:  Vitals:   04/25/21 1219  TempSrc: Oral  PainSc: 0-No pain         Complications: No notable events documented.

## 2021-04-25 NOTE — Interval H&P Note (Signed)
History and Physical Interval Note:  04/25/2021 12:49 PM  Nancy Mcdowell  has presented today for surgery, with the diagnosis of bilateral axillary lipomas.  The various methods of treatment have been discussed with the patient and family. After consideration of risks, benefits and other options for treatment, the patient has consented to  Procedure(s) with comments: EXCISION LIPOMA, bilateral axillary lipomas (Bilateral) - Provider requesting 2.5 hours / 150 minutes for procedure. as a surgical intervention.  The patient's history has been reviewed, patient examined, no change in status, stable for surgery.  I have reviewed the patient's chart and labs.  Questions were answered to the patient's satisfaction.     Eron Staat

## 2021-04-25 NOTE — Op Note (Signed)
°  Procedure Date:  04/25/2021  Pre-operative Diagnosis:  Enlarged bilateral axillary fat pads  Post-operative Diagnosis: Enlarged bilateral axillary fat pads (Left 12 x 8 cm; Right 8 x 4 cm)  Procedure:  Excision of bilateral axillary fat pads  Surgeon:  Melvyn Neth, MD  Anesthesia:  General endotracheal  Estimated Blood Loss:  15 ml  Specimens:  Left and right axillary fat  Complications:  None  Indications for Procedure:  This is a 55 y.o. female with diagnosis of enlarging bilateral axillary fat pads.  The patient wishes to have this excised. The risks of bleeding, abscess or infection, injury to surrounding structures, and need for further procedures were all discussed with the patient and she was willing to proceed.  Description of Procedure: The patient was correctly identified in the preoperative area and brought into the operating room.  The patient was placed supine with VTE prophylaxis in place.  Appropriate time-outs were performed.  Anesthesia was induced and the patient was intubated.  Appropriate antibiotics were infused.  The patient's bilateral axilla were prepped and draped in usual sterile fashion.  We started on the left side.  A 10 incision was made over the fat, and cautery was used to dissect down the subcutaneous tissue to the excess fat itself.  Skin flaps were created using cautery as well, and then the axillary fat pad was excised using cautery, intact.  It was sent off to pathology.  The cavity was then irrigated and hemostasis was assured with cautery.  Local anesthetic was infused intradermally.  The skin flaps were then approximated to the wound bed using multiple 0 Vicryl sutures in order to decrease the amount of dead space in the wound.  The wound edges were then closed in two layers using alternating 2-0 and 3-0 Vicryl and 4-0 Monocryl.  The incision was cleaned and sealed with DermaBond.  We then proceeded to the right axilla.  An 8 cm incision was  made over the fat, and cautery was used to dissect down the subcutaneous tissue to the excess fat itself.  Skin flaps were created using cautery as well, and then the axillary fat pad was excised using cautery, intact.  It was sent off to pathology.  The cavity was then irrigated and hemostasis was assured with cautery.  Local anesthetic was infused intradermally.  The skin flaps were then approximated to the wound bed using multiple 0 Vicryl sutures in order to decrease the amount of dead space in the wound.  The wound edges were then closed in two layers using alternating 2-0 and 3-0 Vicryl and 4-0 Monocryl.  The incision was cleaned and sealed with DermaBond.  The patient was then emerged from anesthesia, extubated, and brought to the recovery room for further management.    The patient tolerated the procedure well and all counts were correct at the end of the case.   Melvyn Neth, MD

## 2021-04-25 NOTE — Anesthesia Procedure Notes (Signed)
Procedure Name: Intubation Date/Time: 04/25/2021 1:25 PM Performed by: Kelton Pillar, CRNA Pre-anesthesia Checklist: Patient identified, Emergency Drugs available, Suction available and Patient being monitored Patient Re-evaluated:Patient Re-evaluated prior to induction Oxygen Delivery Method: Circle system utilized Preoxygenation: Pre-oxygenation with 100% oxygen Induction Type: IV induction Ventilation: Mask ventilation without difficulty Laryngoscope Size: McGraph and 3 Grade View: Grade I Tube type: Oral Tube size: 6.5 mm Number of attempts: 1 Airway Equipment and Method: Stylet and Oral airway Placement Confirmation: ETT inserted through vocal cords under direct vision, positive ETCO2, breath sounds checked- equal and bilateral and CO2 detector Secured at: 21 cm Tube secured with: Tape Dental Injury: Teeth and Oropharynx as per pre-operative assessment

## 2021-04-25 NOTE — Discharge Instructions (Signed)

## 2021-04-26 ENCOUNTER — Encounter: Payer: Self-pay | Admitting: Surgery

## 2021-04-26 NOTE — Anesthesia Postprocedure Evaluation (Signed)
Anesthesia Post Note  Patient: Nancy Mcdowell  Procedure(s) Performed: EXCISION LIPOMA, bilateral axillary lipomas (Bilateral: Axilla)  Patient location during evaluation: PACU Anesthesia Type: General Level of consciousness: awake and alert Pain management: pain level controlled Vital Signs Assessment: post-procedure vital signs reviewed and stable Respiratory status: spontaneous breathing, nonlabored ventilation, respiratory function stable and patient connected to nasal cannula oxygen Cardiovascular status: blood pressure returned to baseline and stable Postop Assessment: no apparent nausea or vomiting Anesthetic complications: no   No notable events documented.   Last Vitals:  Vitals:   04/25/21 1710 04/25/21 1718  BP: 120/76 (!) 151/86  Pulse: 76 61  Resp:  16  Temp:  (!) 36.1 C  SpO2: 96% 95%    Last Pain:  Vitals:   04/26/21 0937  TempSrc:   PainSc: 5                  Martha Clan

## 2021-04-27 LAB — SURGICAL PATHOLOGY

## 2021-05-01 ENCOUNTER — Telehealth: Payer: Self-pay | Admitting: *Deleted

## 2021-05-01 NOTE — Telephone Encounter (Signed)
Left a message for the patient to call back with any further questions. Message left letting her know that all stitches used during surgery are dissolvable. They will dissolve in 1-2 months.

## 2021-05-01 NOTE — Telephone Encounter (Signed)
Patient called and stated that she had surgery on 04/25/21 Dr Hampton Abbot- excision lipoma bilateral axillary, and she wants to know if the stitches are dissolvable or do they need to be removed. She also stated that they are very itchy, is that normal? Please call and advise

## 2021-05-01 NOTE — Interval H&P Note (Signed)
History and Physical Interval Note:  05/01/2021 2:40 PM  Nancy Mcdowell  has presented today for surgery, with the diagnosis of bilateral axillary lipomas.  The various methods of treatment have been discussed with the patient and family. After consideration of risks, benefits and other options for treatment, the patient has consented to  Procedure(s) with comments: EXCISION LIPOMA, bilateral axillary lipomas (Bilateral) - Provider requesting 2.5 hours / 150 minutes for procedure. as a surgical intervention.  The patient's history has been reviewed, patient examined, no change in status, stable for surgery.  I have reviewed the patient's chart and labs.  Questions were answered to the patient's satisfaction.     Judeen Geralds

## 2021-05-03 ENCOUNTER — Ambulatory Visit (INDEPENDENT_AMBULATORY_CARE_PROVIDER_SITE_OTHER): Payer: 59 | Admitting: Nurse Practitioner

## 2021-05-03 ENCOUNTER — Other Ambulatory Visit: Payer: Self-pay

## 2021-05-03 ENCOUNTER — Encounter: Payer: Self-pay | Admitting: Nurse Practitioner

## 2021-05-03 VITALS — BP 123/72 | HR 77 | Temp 99.2°F | Ht 64.0 in | Wt 206.0 lb

## 2021-05-03 DIAGNOSIS — E559 Vitamin D deficiency, unspecified: Secondary | ICD-10-CM | POA: Diagnosis not present

## 2021-05-03 DIAGNOSIS — Z13220 Encounter for screening for lipoid disorders: Secondary | ICD-10-CM

## 2021-05-03 DIAGNOSIS — Z136 Encounter for screening for cardiovascular disorders: Secondary | ICD-10-CM

## 2021-05-03 DIAGNOSIS — G629 Polyneuropathy, unspecified: Secondary | ICD-10-CM | POA: Diagnosis not present

## 2021-05-03 DIAGNOSIS — Z0000 Encounter for general adult medical examination without abnormal findings: Secondary | ICD-10-CM | POA: Diagnosis not present

## 2021-05-03 NOTE — Assessment & Plan Note (Signed)
Chronic, ongoing.  Check Mag, Folate, B12 today.

## 2021-05-03 NOTE — Progress Notes (Signed)
BP 123/72    Pulse 77    Temp 99.2 F (37.3 C) (Oral)    Ht 5\' 4"  (1.626 m)    Wt 206 lb (93.4 kg)    LMP  (LMP Unknown) Comment: 3 years ago   SpO2 99%    BMI 35.36 kg/m    Subjective:    Patient ID: Nancy Mcdowell, female    DOB: Jul 15, 1965, 55 y.o.   MRN: [id number]  HPI: Nancy Mcdowell is a 55 y.o. female presenting on 05/03/2021 for comprehensive medical examination. Current medical complaints include:none  She currently lives with: brother Menopausal Symptoms: no  Depression Screen done today and results listed below:  Depression screen [facility] 2/9 05/03/2021 03/16/2021 05/22/2016 05/10/2015  Decreased Interest 0 0 0 0  Down, Depressed, Hopeless 0 0 0 0  PHQ - 2 Score 0 0 0 0  Altered sleeping 0 0 - -  Tired, decreased energy 2 1 - -  Change in appetite 0 0 - -  Feeling bad or failure about yourself  0 0 - -  Trouble concentrating 0 0 - -  Moving slowly or fidgety/restless 0 0 - -  Suicidal thoughts 0 0 - -  PHQ-9 Score 2 1 - -  Difficult doing work/chores - Not difficult at all - -    Fall Risk 05/10/2015 05/22/2016 03/16/2021 04/25/2021 05/03/2021  Falls in the past year? No No 0 - 0  Was there an injury with Fall? - - 0 - 0  Fall Risk Category Calculator - - 0 - 0  Fall Risk Category - - Low - Low  Patient Fall Risk Level - - Low fall risk High fall risk -  Patient at Risk for Falls Due to - - No Fall Risks - History of fall(s)  Fall risk Follow up - - Falls evaluation completed - Falls evaluation completed    Functional Status Survey: Is the patient deaf or have difficulty hearing?: No Does the patient have difficulty seeing, even when wearing glasses/contacts?: No Does the patient have difficulty concentrating, remembering, or making decisions?: No Does the patient have difficulty walking or climbing stairs?: No Does the patient have difficulty dressing or bathing?: No Does the patient have difficulty doing errands alone such as visiting a doctor's office or  shopping?: No   Past Medical History:  Past Medical History:  Diagnosis Date   Alcohol abuse (1 bottle wine qoday) 02/15/2020   Anemia 12/25/2016   Cataract    COVID-19 03/2021   dx week of thanksgiving   GERD (gastroesophageal reflux disease)    Heart murmur    Said she was told she had one at 20   Sleep apnea    mild-was not given cpap    Surgical History:  Past Surgical History:  Procedure Laterality Date   COLONOSCOPY WITH PROPOFOL N/A 02/04/2017   Procedure: COLONOSCOPY WITH PROPOFOL;  Surgeon: [name], MD;  Location: [facility] ENDOSCOPY;  Service: Gastroenterology;  Laterality: N/A;   EYE SURGERY Left 2010   retinal detachment   LIPOMA EXCISION Bilateral 04/25/2021   Procedure: EXCISION LIPOMA, bilateral axillary lipomas;  Surgeon: Olean Ree, MD;  Location: [facility] ORS;  Service: General;  Laterality: Bilateral;  Provider requesting 2.5 hours / 150 minutes for procedure.   RETINAL DETACHMENT SURGERY Left 2010, 2011   2    Medications:  Current Outpatient Medications on File Prior to Visit  Medication Sig   acetaminophen (TYLENOL) 500 MG tablet Take 2 tablets (1,000 mg total)  by mouth every 6 (six) hours as needed for mild pain.   ibuprofen (ADVIL) 600 MG tablet Take 1 tablet (600 mg total) by mouth every 8 (eight) hours as needed for moderate pain.   oxyCODONE (OXY IR/ROXICODONE) 5 MG immediate release tablet Take 1 tablet (5 mg total) by mouth every 4 (four) hours as needed for severe pain.   pantoprazole (PROTONIX) 40 MG tablet Take 40 mg by mouth daily as needed (Acid reflux).   No current facility-administered medications on file prior to visit.    Allergies:  No Known Allergies  Social History:  Social History   Socioeconomic History   Marital status: Legally Separated    Spouse name: Not on file   Number of children: Not on file   Years of education: Not on file   Highest education level: Not on file  Occupational History   Not on file  Tobacco Use    Smoking status: Never   Smokeless tobacco: Never  Vaping Use   Vaping Use: Never used  Substance and Sexual Activity   Alcohol use: Yes    Alcohol/week: 5.0 standard drinks    Types: 5 Glasses of wine per week    Comment: weekly glass of wine   Drug use: Not Currently    Types: Marijuana   Sexual activity: Yes    Partners: Male    Birth control/protection: Post-menopausal, Condom  Other Topics Concern   Not on file  Social History Narrative   Not on file   Social Determinants of Health   Financial Resource Strain: Low Risk    Difficulty of Paying Living Expenses: Not hard at all  Food Insecurity: No Food Insecurity   Worried About Charity fundraiser in the Last Year: Never true   Ran Out of Food in the Last Year: Never true  Transportation Needs: No Transportation Needs   Lack of Transportation (Medical): No   Lack of Transportation (Non-Medical): No  Physical Activity: Insufficiently Active   Days of Exercise per Week: 2 days   Minutes of Exercise per Session: 30 min  Stress: No Stress Concern Present   Feeling of Stress : Only a little  Social Connections: Moderately Isolated   Frequency of Communication with Friends and Family: More than three times a week   Frequency of Social Gatherings with Friends and Family: More than three times a week   Attends Religious Services: More than 4 times per year   Active Member of [employer] or Organizations: No   Attends Music therapist: Never   Marital Status: Never married  Human resources officer Violence: Not At Risk   Fear of Current or Ex-Partner: No   Emotionally Abused: No   Physically Abused: No   Sexually Abused: No   Social History   Tobacco Use  Smoking Status Never  Smokeless Tobacco Never   Social History   Substance and Sexual Activity  Alcohol Use Yes   Alcohol/week: 5.0 standard drinks   Types: 5 Glasses of wine per week   Comment: weekly glass of wine    Family History:  Family History   Problem Relation Age of Onset   Hyperlipidemia Mother    Hypertension Mother    Stroke Mother    Arthritis Mother    Kidney disease Mother    Thyroid disease Mother    Kidney disease Father    Heart attack Father    Diabetes Father    Cancer Maternal Aunt  Breast Cancer   Breast cancer Maternal Aunt 59   Breast cancer Maternal Aunt 60   Parkinson's disease Maternal Grandmother    Heart attack Paternal Grandfather    Breast cancer Cousin     Past medical history, surgical history, medications, allergies, family history and social history reviewed with patient today and changes made to appropriate areas of the chart.   Review of Systems  Constitutional:  Negative for chills, fever and malaise/fatigue.  HENT: Negative.    Respiratory: Negative.    Cardiovascular: Negative.   Neurological: Negative.   Psychiatric/Behavioral: Negative.     All other ROS negative except what is listed above and in the HPI.      Objective:    BP 123/72    Pulse 77    Temp 99.2 F (37.3 C) (Oral)    Ht 5\' 4"  (1.626 m)    Wt 206 lb (93.4 kg)    LMP  (LMP Unknown) Comment: 3 years ago   SpO2 99%    BMI 35.36 kg/m   Wt Readings from Last 3 Encounters:  05/03/21 206 lb (93.4 kg)  04/25/21 200 lb (90.7 kg)  04/03/21 207 lb 3.2 oz (94 kg)    Physical Exam Vitals and nursing note reviewed. Exam conducted with a chaperone present.  Constitutional:      General: She is awake. She is not in acute distress.    Appearance: She is well-developed and well-groomed. She is obese. She is not ill-appearing or toxic-appearing.  HENT:     Head: Normocephalic and atraumatic.     Right Ear: Hearing, tympanic membrane, ear canal and external ear normal. No drainage.     Left Ear: Hearing, tympanic membrane, ear canal and external ear normal. No drainage.     Nose: Nose normal.     Right Sinus: No maxillary sinus tenderness or frontal sinus tenderness.     Left Sinus: No maxillary sinus tenderness or  frontal sinus tenderness.     Mouth/Throat:     Mouth: Mucous membranes are moist.     Pharynx: Oropharynx is clear. Uvula midline. No pharyngeal swelling, oropharyngeal exudate or posterior oropharyngeal erythema.  Eyes:     General: Lids are normal.        Right eye: No discharge.        Left eye: No discharge.     Extraocular Movements: Extraocular movements intact.     Conjunctiva/sclera: Conjunctivae normal.     Pupils: Pupils are equal, round, and reactive to light.     Visual Fields: Right eye visual fields normal and left eye visual fields normal.  Neck:     Thyroid: No thyromegaly.     Vascular: No carotid bruit.     Trachea: Trachea normal.  Cardiovascular:     Rate and Rhythm: Normal rate and regular rhythm.     Heart sounds: Normal heart sounds. No murmur heard.   No gallop.  Pulmonary:     Effort: Pulmonary effort is normal. No accessory muscle usage or respiratory distress.     Breath sounds: Normal breath sounds.  Abdominal:     General: Bowel sounds are normal.     Palpations: Abdomen is soft. There is no hepatomegaly or splenomegaly.     Tenderness: There is no abdominal tenderness.  Musculoskeletal:        General: Normal range of motion.     Cervical back: Normal range of motion and neck supple.     Right lower leg: No edema.  Left lower leg: No edema.  Lymphadenopathy:     Head:     Right side of head: No submental, submandibular, tonsillar, preauricular or posterior auricular adenopathy.     Left side of head: No submental, submandibular, tonsillar, preauricular or posterior auricular adenopathy.     Cervical: No cervical adenopathy.  Skin:    General: Skin is warm and dry.     Capillary Refill: Capillary refill takes less than 2 seconds.     Findings: No rash.  Neurological:     Mental Status: She is alert and oriented to person, place, and time.     Gait: Gait is intact.     Deep Tendon Reflexes: Reflexes are normal and symmetric.     Reflex  Scores:      Brachioradialis reflexes are 2+ on the right side and 2+ on the left side.      Patellar reflexes are 2+ on the right side and 2+ on the left side. Psychiatric:        Attention and Perception: Attention normal.        Mood and Affect: Mood normal.        Speech: Speech normal.        Behavior: Behavior normal. Behavior is cooperative.        Thought Content: Thought content normal.        Judgment: Judgment normal.   Results for orders placed or performed during the hospital encounter of 04/25/21  Surgical pathology  Result Value Ref Range   SURGICAL PATHOLOGY      SURGICAL PATHOLOGY CASE: [id number] PATIENT: Buddy Duty Surgical Pathology Report     Specimen Submitted: A. Axillary fat, left B. Axillary fat, right  Clinical History: Bilateral axillary lipomas      DIAGNOSIS: A. AXILLARY FAT, LEFT; EXCISION: - MATURE ADIPOSE TISSUE, CLINICALLY LIPOMA. - NEGATIVE FOR MALIGNANCY.  B.  AXILLARY FAT, RIGHT; EXCISION: - MATURE ADIPOSE TISSUE, CLINICALLY LIPOMA. - UNREMARKABLE SKIN. - NEGATIVE FOR MALIGNANCY.  GROSS DESCRIPTION: A. Labeled: Left axillary fat Received: Fresh Collection time: 2:09 PM on 04/25/2021 Placed into formalin time: 4:16 PM on 04/25/2021 Tissue fragment(s): 1 Size: 10.1 x 7.9 x 3.8 cm Description: Received is a portion of yellow lobulated adipose tissue. The specimen is unoriented and inked green.  The specimen serially sectioned revealing yellow lobulated adipose tissue admixed with a scant amount of tan fine fibrous tissue.  No distinct masses or lesions are grossly identified. Repre sentative sections (1/cm) are submitted in cassettes 1-5.  B. Labeled: Right axillary fat Received: Fresh Collection time: 3:14 PM on 04/25/2021 Placed into formalin time: 4:16 PM on 04/25/2021 Tissue fragment(s): 1 Size: 7.8 x 6.5 x 3.4 cm Description: Received is a portion of yellow lobulated adipose tissue with a 6.7 x 0.8 cm ellipse  of brown finely lobulated skin.  The specimen is unoriented and inked blue.  Sectioning reveals yellow lobulated adipose tissue admixed with a scant amount of tan fibrous tissue and a central prominent blood vessel.  No distinct masses or lesions are grossly identified. Representative sections (1/cm) are submitted in cassettes 1-4.  RB 04/26/2021  Final Diagnosis performed by Quay Burow, MD.   Electronically signed 04/27/2021 9:46:40AM The electronic signature indicates that the named Attending Pathologist has evaluated the specimen Technical component performed at [facility], [address], [place], [place] [address] Lab: [phone]  Dir: Rush Farmer, MD, MMM  Professional component performed at [facility], [facility], [place], [place], [place] [address] Lab: [phone] Dir:  Kathi Simpers, MD       Assessment & Plan:   Problem List Items Addressed This Visit       Nervous and Auditory   Neuropathy - Primary    Chronic, ongoing.  Check Mag, Folate, B12 today.      Relevant Orders   Vitamin B12   Magnesium   Folate   Other Visit Diagnoses     Vitamin D deficiency       History of low levels, check today and initiate supplement as needed.   Relevant Orders   VITAMIN D 25 Hydroxy (Vit-D Deficiency, Fractures)   Encounter for lipid screening for cardiovascular disease       Cholesterol check today.   Relevant Orders   Lipid Panel w/o Chol/HDL Ratio   Encounter for annual physical exam       Annual physical today with labs and health maintenance reviewed with patient.        Follow up plan: Return in about 1 year (around 05/03/2022) for Annual physical.   LABORATORY TESTING:  - Pap smear: up to date  IMMUNIZATIONS:   - Tdap: Tetanus vaccination status reviewed: she reports getting 3-5 years ago at [facility]. - Influenza: Refused - Pneumovax: Not applicable - Prevnar: Not applicable - COVID: Refused - HPV: Refused - Shingrix  vaccine:  will think about this  SCREENING: -Mammogram: Up to date  - Colonoscopy: Up to date  - Bone Density: Not applicable  -Hearing Test: Not applicable  -Spirometry: Not applicable   PATIENT COUNSELING:   Advised to take 1 mg of folate supplement per day if capable of pregnancy.   Sexuality: Discussed sexually transmitted diseases, partner selection, use of condoms, avoidance of unintended pregnancy  and contraceptive alternatives.   Advised to avoid cigarette smoking.  I discussed with the patient that most people either abstain from alcohol or drink within safe limits (<=14/week and <=4 drinks/occasion for males, <=7/weeks and <= 3 drinks/occasion for females) and that the risk for alcohol disorders and other health effects rises proportionally with the number of drinks per week and how often a drinker exceeds daily limits.  Discussed cessation/primary prevention of drug use and availability of treatment for abuse.   Diet: Encouraged to adjust caloric intake to maintain  or achieve ideal body weight, to reduce intake of dietary saturated fat and total fat, to limit sodium intake by avoiding high sodium foods and not adding table salt, and to maintain adequate dietary potassium and calcium preferably from fresh fruits, vegetables, and low-fat dairy products.    Stressed the importance of regular exercise  Injury prevention: Discussed safety belts, safety helmets, smoke detector, smoking near bedding or upholstery.   Dental health: Discussed importance of regular tooth brushing, flossing, and dental visits.    NEXT PREVENTATIVE PHYSICAL DUE IN 1 YEAR. Return in about 1 year (around 05/03/2022) for Annual physical.

## 2021-05-03 NOTE — Patient Instructions (Signed)
Healthy Eating °Following a healthy eating pattern may help you to achieve and maintain a healthy body weight, reduce the risk of chronic disease, and live a long and productive life. It is important to follow a healthy eating pattern at an appropriate calorie level for your body. Your nutritional needs should be met primarily through food by choosing a variety of nutrient-rich foods. °What are tips for following this plan? °Reading food labels °Read labels and choose the following: °Reduced or low sodium. °Juices with 100% fruit juice. °Foods with low saturated fats and high polyunsaturated and monounsaturated fats. °Foods with whole grains, such as whole wheat, cracked wheat, brown rice, and wild rice. °Whole grains that are fortified with folic acid. This is recommended for women who are pregnant or who want to become pregnant. °Read labels and avoid the following: °Foods with a lot of added sugars. These include foods that contain brown sugar, corn sweetener, corn syrup, dextrose, fructose, glucose, high-fructose corn syrup, honey, invert sugar, lactose, malt syrup, maltose, molasses, raw sugar, sucrose, trehalose, or turbinado sugar. °Do not eat more than the following amounts of added sugar per day: °6 teaspoons (25 g) for women. °9 teaspoons (38 g) for men. °Foods that contain processed or refined starches and grains. °Refined grain products, such as white flour, degermed cornmeal, white bread, and white rice. °Shopping °Choose nutrient-rich snacks, such as vegetables, whole fruits, and nuts. Avoid high-calorie and high-sugar snacks, such as potato chips, fruit snacks, and candy. °Use oil-based dressings and spreads on foods instead of solid fats such as butter, stick margarine, or cream cheese. °Limit pre-made sauces, mixes, and "instant" products such as flavored rice, instant noodles, and ready-made pasta. °Try more plant-protein sources, such as tofu, tempeh, black beans, edamame, lentils, nuts, and  seeds. °Explore eating plans such as the Mediterranean diet or vegetarian diet. °Cooking °Use oil to sauté or stir-fry foods instead of solid fats such as butter, stick margarine, or lard. °Try baking, boiling, grilling, or broiling instead of frying. °Remove the fatty part of meats before cooking. °Steam vegetables in water or broth. °Meal planning ° °At meals, imagine dividing your plate into fourths: °One-half of your plate is fruits and vegetables. °One-fourth of your plate is whole grains. °One-fourth of your plate is protein, especially lean meats, poultry, eggs, tofu, beans, or nuts. °Include low-fat dairy as part of your daily diet. °Lifestyle °Choose healthy options in all settings, including home, work, school, restaurants, or stores. °Prepare your food safely: °Wash your hands after handling raw meats. °Keep food preparation surfaces clean by regularly washing with hot, soapy water. °Keep raw meats separate from ready-to-eat foods, such as fruits and vegetables. °Cook seafood, meat, poultry, and eggs to the recommended internal temperature. °Store foods at safe temperatures. In general: °Keep cold foods at 40°F (4.4°C) or below. °Keep hot foods at 140°F (60°C) or above. °Keep your freezer at 0°F (-17.8°C) or below. °Foods are no longer safe to eat when they have been between the temperatures of 40°-140°F (4.4-60°C) for more than 2 hours. °What foods should I eat? °Fruits °Aim to eat 2 cup-equivalents of fresh, canned (in natural juice), or frozen fruits each day. Examples of 1 cup-equivalent of fruit include 1 small apple, 8 large strawberries, 1 cup canned fruit, ½ cup dried fruit, or 1 cup 100% juice. °Vegetables °Aim to eat 2½-3 cup-equivalents of fresh and frozen vegetables each day, including different varieties and colors. Examples of 1 cup-equivalent of vegetables include 2 medium carrots, 2 cups raw,   leafy greens, 1 cup chopped vegetable (raw or cooked), or 1 medium baked potato. °Grains °Aim to  eat 6 ounce-equivalents of whole grains each day. Examples of 1 ounce-equivalent of grains include 1 slice of bread, 1 cup ready-to-eat cereal, 3 cups popcorn, or ½ cup cooked rice, pasta, or cereal. °Meats and other proteins °Aim to eat 5-6 ounce-equivalents of protein each day. Examples of 1 ounce-equivalent of protein include 1 egg, 1/2 cup nuts or seeds, or 1 tablespoon (16 g) peanut butter. A cut of meat or fish that is the size of a deck of cards is about 3-4 ounce-equivalents. °Of the protein you eat each week, try to have at least 8 ounces come from seafood. This includes salmon, trout, herring, and anchovies. °Dairy °Aim to eat 3 cup-equivalents of fat-free or low-fat dairy each day. Examples of 1 cup-equivalent of dairy include 1 cup (240 mL) milk, 8 ounces (250 g) yogurt, 1½ ounces (44 g) natural cheese, or 1 cup (240 mL) fortified soy milk. °Fats and oils °Aim for about 5 teaspoons (21 g) per day. Choose monounsaturated fats, such as canola and olive oils, avocados, peanut butter, and most nuts, or polyunsaturated fats, such as sunflower, corn, and soybean oils, walnuts, pine nuts, sesame seeds, sunflower seeds, and flaxseed. °Beverages °Aim for six 8-oz glasses of water per day. Limit coffee to three to five 8-oz cups per day. °Limit caffeinated beverages that have added calories, such as soda and energy drinks. °Limit alcohol intake to no more than 1 drink a day for nonpregnant women and 2 drinks a day for men. One drink equals 12 oz of beer (355 mL), 5 oz of wine (148 mL), or 1½ oz of hard liquor (44 mL). °Seasoning and other foods °Avoid adding excess amounts of salt to your foods. Try flavoring foods with herbs and spices instead of salt. °Avoid adding sugar to foods. °Try using oil-based dressings, sauces, and spreads instead of solid fats. °This information is based on general [place] nutrition guidelines. For more information, visit choosemyplate.gov. Exact amounts may vary based on your nutrition  needs. °Summary °A healthy eating plan may help you to maintain a healthy weight, reduce the risk of chronic diseases, and stay active throughout your life. °Plan your meals. Make sure you eat the right portions of a variety of nutrient-rich foods. °Try baking, boiling, grilling, or broiling instead of frying. °Choose healthy options in all settings, including home, work, school, restaurants, or stores. °This information is not intended to replace advice given to you by your health care provider. Make sure you discuss any questions you have with your health care provider. °Document Revised: 12/27/2020 Document Reviewed: 12/27/2020 °Elsevier Patient Education © 2022 [employer] Inc. ° °

## 2021-05-04 LAB — FOLATE: Folate: 9.4 ng/mL (ref >3.0)

## 2021-05-04 LAB — LIPID PANEL W/O CHOL/HDL RATIO
Cholesterol, Total: 203 mg/dL — ABNORMAL HIGH (ref 100–199)
HDL: 45 mg/dL (ref >39)
LDL Chol Calc (NIH): 143 mg/dL — ABNORMAL HIGH (ref 0–99)
Triglycerides: 84 mg/dL (ref 0–149)
VLDL Cholesterol Cal: 15 mg/dL (ref 5–40)

## 2021-05-04 LAB — VITAMIN D 25 HYDROXY (VIT D DEFICIENCY, FRACTURES): Vit D, 25-Hydroxy: 39.6 ng/mL (ref 30.0–100.0)

## 2021-05-04 LAB — MAGNESIUM: Magnesium: 2.2 mg/dL (ref 1.6–2.3)

## 2021-05-04 LAB — VITAMIN B12: Vitamin B-12: 446 pg/mL (ref 232–1245)

## 2021-05-04 NOTE — Progress Notes (Signed)
Contacted via MyChart The 10-year ASCVD risk score (Arnett DK, et al., 2019) is: 2.9%   Values used to calculate the score:     Age: 55 years     Sex: Female     Is Non-Hispanic African American: Yes     Diabetic: No     Tobacco smoker: No     Systolic Blood Pressure: 122 mmHg     Is BP treated: No     HDL Cholesterol: 45 mg/dL     Total Cholesterol: 203 mg/dL   Good evening Olivia Mackie, your labs have returned: - Vitamin B12, Vitamin D, magnesium, and folate are all normal. - Your cholesterol is high, but recommendations to make lifestyle changes. Your LDL is above normal. The LDL is the bad cholesterol. Over time and in combination with inflammation and other factors, this contributes to plaque which in turn may lead to stroke and/or heart attack down the road. Sometimes high LDL is primarily genetic, and people might be eating all the right foods but still have high numbers. Other times, there is room for improvement in one's diet and eating healthier can bring this number down and potentially reduce one's risk of heart attack and/or stroke.   To reduce your LDL, Remember - more fruits and vegetables, more fish, and limit red meat and dairy products. More soy, nuts, beans, barley, lentils, oats and plant sterol ester enriched margarine instead of butter. I also encourage eliminating sugar and processed food. Remember, shop on the outside of the grocery store and visit your [employer]. If you would like to talk with me about dietary changes for your cholesterol, please let me know. We should recheck your cholesterol in 6-12 months.  Any questions? Keep being stellar!!  Thank you for allowing me to participate in your care.  I appreciate you. Kindest regards, Lamere Lightner

## 2021-05-12 ENCOUNTER — Other Ambulatory Visit: Payer: Self-pay

## 2021-05-12 ENCOUNTER — Encounter: Payer: Self-pay | Admitting: Physician Assistant

## 2021-05-12 ENCOUNTER — Ambulatory Visit (INDEPENDENT_AMBULATORY_CARE_PROVIDER_SITE_OTHER): Payer: 59 | Admitting: Physician Assistant

## 2021-05-12 VITALS — BP 136/84 | HR 77 | Temp 98.9°F | Ht 63.5 in | Wt 207.2 lb

## 2021-05-12 DIAGNOSIS — Z09 Encounter for follow-up examination after completed treatment for conditions other than malignant neoplasm: Secondary | ICD-10-CM

## 2021-05-12 DIAGNOSIS — D1722 Benign lipomatous neoplasm of skin and subcutaneous tissue of left arm: Secondary | ICD-10-CM

## 2021-05-12 MED ORDER — GABAPENTIN 300 MG PO CAPS: 300.0000 mg | ORAL_CAPSULE | Freq: Three times a day (TID) | ORAL | 0 refills | Status: DC

## 2021-05-12 NOTE — Patient Instructions (Addendum)
Areas are healing well. The glue will continue to come off. The skin areas will close up on their own. If the area starts to drain you may place a gauze covering to protect your clothes.   The tingling and numbness should get better with time as the swelling goes down.   We have sent you in a prescription for Lyrica, this is for nerve pain. You may use this with the Ibuprofen and Tylenol.   We will have you follow up here in 2 weeks.

## 2021-05-12 NOTE — Progress Notes (Signed)
[facility]ATES POST-OP OFFICE VISIT  05/12/2021  HPI: Nancy Mcdowell is a 55 y.o. female 17 days s/p excision of bilateral axillary fat pads (Left 12 x 8 cm; Right 8 x 4 cm) with Dr Hampton Abbot.   She is doing okay, she continues to have right > left axillary soreness and some shooting pains down her right arm with movement. She denied any weakness or sensation deficits. She is trying ibuprofen without significant improvements. Otherwise, she denied any issues with her incisions.   Vital signs: BP 136/84    Pulse 77    Temp 98.9 F (37.2 C)    Ht 5' 3.5" (1.613 m)    Wt 207 lb 3.2 oz (94 kg)    LMP  (LMP Unknown) Comment: 3 years ago   SpO2 99%    BMI 36.13 kg/m    Physical Exam: Constitutional: Well appearing female, NAD Skin: Right axillary incision is well healed, no erythema or drainage. Her left axillary incision is mostly completely healed aside from a 1-2 cm area to the medical aspect which looks like the skin edges separated, no erythema or drainge MSK: She has 5/5 strength bilaterally to her upper extremities, sensation grossly intact  Assessment/Plan: This is a 55 y.o. female 17 days s/p excision of bilateral axillary fat pads (Left 12 x 8 cm; Right 8 x 4 cm)   - I will send her 300 mg Gabapentin TID for 14 days to try and help with what I believe she is describing to be nerve pain. May be secondary to residual swelling.inflammation from recent procedure.   - Pathology Reviewed: Lipoma, negative for malignancy  - She can return to clinic in 2 weeks for recheck   -- Edison Simon, PA-C [facility] 05/12/2021, 10:32 AM [phone] M-F: 7am - 4pm

## 2021-05-25 ENCOUNTER — Encounter: Payer: Self-pay | Admitting: Physician Assistant

## 2021-05-25 ENCOUNTER — Other Ambulatory Visit: Payer: Self-pay

## 2021-05-25 ENCOUNTER — Ambulatory Visit (INDEPENDENT_AMBULATORY_CARE_PROVIDER_SITE_OTHER): Payer: Self-pay | Admitting: Physician Assistant

## 2021-05-25 VITALS — BP 137/88 | HR 73 | Temp 98.8°F | Ht 63.5 in | Wt 207.0 lb

## 2021-05-25 DIAGNOSIS — D1722 Benign lipomatous neoplasm of skin and subcutaneous tissue of left arm: Secondary | ICD-10-CM

## 2021-05-25 DIAGNOSIS — D1721 Benign lipomatous neoplasm of skin and subcutaneous tissue of right arm: Secondary | ICD-10-CM

## 2021-05-25 DIAGNOSIS — Z09 Encounter for follow-up examination after completed treatment for conditions other than malignant neoplasm: Secondary | ICD-10-CM

## 2021-05-25 MED ORDER — PREGABALIN 50 MG PO CAPS: 50.0000 mg | ORAL_CAPSULE | Freq: Three times a day (TID) | ORAL | 0 refills | Status: DC

## 2021-05-25 NOTE — Patient Instructions (Signed)
The areas will continue to flatten and soften. We will send you a prescription for Lyrica that you can take three times a day. This should not make you drowsy and loopy like the Gabapentin.  The nerve pain will slowly get better over time. Continue to do range of motion exercises with your arms.  Keep a dry gauze over the open area until it closes fully.   Follow up her in 2 weeks with Dr Hampton Abbot.

## 2021-05-25 NOTE — Progress Notes (Signed)
[facility]ATES POST-OP OFFICE VISIT  05/25/2021  HPI: Nancy Mcdowell is a 56 y.o. female 30 days s/p excision of bilateral axillary fat pads (Left 12 x 8 cm; Right 8 x 4 cm) with Dr Hampton Abbot.   She continues to have tingling and "nerve pains" with supination of her right arm. This is reportedly mildly better since the last time I saw her. She denied any weakness or sensory loss in the extremity. She has gabapentin but only takes this QHS secondary to drowsiness and sedative side effects. She believes this helps cause she is able to sleep.   In her left axilla, the incisions is sore and there continues to be a small area (~1 cm) where the skin edge dehisced. She also has some tingling with supination of her left arm, but this is much more milder. Again, no weakness or sensory loss.   Vital signs: BP 137/88    Pulse 73    Temp 98.8 F (37.1 C)    Ht 5' 3.5" (1.613 m)    Wt 207 lb (93.9 kg)    LMP  (LMP Unknown) Comment: 3 years ago   SpO2 98%    BMI 36.09 kg/m    Physical Exam: Constitutional: Well appearing female, NAD Skin: Right axillary incision is well healed, no erythema or drainage. Her left axillary incision is mostly completely healed aside from a 1 cm area to the medical aspect which looks like the skin edges separated, no erythema or drainge MSK: She has 5/5 strength bilaterally to her upper extremities, sensation grossly intact    Assessment/Plan: This is a 56 y.o. female excision of bilateral axillary fat pads (Left 12 x 8 cm; Right 8 x 4 cm) with Dr Hampton Abbot.   - I will have her stop the gabapentin secondary to the sedative side effects and switch her to 50 mg Lyrica as this can have milder side effects compared to gabapentin.   - Reviewed wound care recommendation  - I will have her follow up in ~2 weeks or so for recheck. She understands to call with questions/concerns in the interim  -- Edison Simon, PA-C [facility] 05/25/2021, 10:42  AM [phone] M-F: 7am - 4pm

## 2021-06-09 ENCOUNTER — Encounter: Payer: Self-pay | Admitting: Surgery

## 2021-06-09 ENCOUNTER — Other Ambulatory Visit: Payer: Self-pay

## 2021-06-09 ENCOUNTER — Ambulatory Visit (INDEPENDENT_AMBULATORY_CARE_PROVIDER_SITE_OTHER): Payer: 59 | Admitting: Surgery

## 2021-06-09 VITALS — BP 130/86 | HR 83 | Temp 98.7°F | Ht 62.5 in | Wt 205.8 lb

## 2021-06-09 DIAGNOSIS — D1722 Benign lipomatous neoplasm of skin and subcutaneous tissue of left arm: Secondary | ICD-10-CM

## 2021-06-09 DIAGNOSIS — D1721 Benign lipomatous neoplasm of skin and subcutaneous tissue of right arm: Secondary | ICD-10-CM

## 2021-06-09 DIAGNOSIS — Z09 Encounter for follow-up examination after completed treatment for conditions other than malignant neoplasm: Secondary | ICD-10-CM

## 2021-06-09 NOTE — Progress Notes (Signed)
06/09/2021  HPI: Nancy Mcdowell is a 56 y.o. female s/p excision of bilateral axillary lipomas on 04/25/21.  She presents for follow up.  Reports that the tingliness in the right arm has now resolved, and there's still some minor tingliness in the left arm.  No motor or major sensory issues on either arm.  The left axillary incision is now fully healed.  Vital signs: BP 130/86    Pulse 83    Temp 98.7 F (37.1 C) (Oral)    Ht 5' 2.5" (1.588 m)    Wt 205 lb 12.8 oz (93.4 kg)    LMP  (LMP Unknown) Comment: 3 years ago   SpO2 95%    BMI 37.04 kg/m    Physical Exam: Constitutional: No acute distress Skin:  Right axillary incision is well healed, without any wound breakdown or seroma.  No lymphadenopathy or masses, only some firmness from scar tissue.  Left axillary incision is now fully healed, without any open areas or infection or seroma.  No lymphadenopathy or masses, also only some firmness from scar tissue.  Assessment/Plan: This is a 56 y.o. female s/p excision of bilateral axillary lipomas.  --Discussed with the patient that the tingliness she's been experiencing is likely related to irritation of the intercostobrachial nerve on either side.  This will continue to improve.  The firmness sensation will improve with time as well.  She can start applying vitamin E lotion or cocoa butter location to help with the scarring. --Follow up as needed.   Melvyn Neth, [facility]

## 2021-06-09 NOTE — Patient Instructions (Addendum)
Please call our office if you have any questions or concerns. You may try Vitamin D ointment daily to help with softening the scarring. Apply a small amount and massage onto the area multiple times daily.

## 2021-08-04 ENCOUNTER — Ambulatory Visit: Payer: Self-pay

## 2021-08-11 ENCOUNTER — Ambulatory Visit: Payer: Self-pay | Admitting: Family Medicine

## 2021-08-11 ENCOUNTER — Encounter: Payer: Self-pay | Admitting: Family Medicine

## 2021-08-11 DIAGNOSIS — B3731 Acute candidiasis of vulva and vagina: Secondary | ICD-10-CM

## 2021-08-11 DIAGNOSIS — Z113 Encounter for screening for infections with a predominantly sexual mode of transmission: Secondary | ICD-10-CM

## 2021-08-11 LAB — HM HIV SCREENING LAB: HM HIV Screening: NEGATIVE

## 2021-08-11 MED ORDER — CLOTRIMAZOLE 1 % VA CREA: 1.0000 | TOPICAL_CREAM | Freq: Every day | VAGINAL | 0 refills | Status: AC

## 2021-08-11 NOTE — Progress Notes (Signed)
[facility] Department ? ?STI clinic/screening visit ?[place] [address] ?[phone] ? ?Subjective:  ?Nancy Mcdowell is a 56 y.o. female being seen today for an STI screening visit. The patient reports they do have symptoms.  Patient reports that they do not desire a pregnancy in the next year.   They reported they are not interested in discussing contraception today.   ? ?No LMP recorded (lmp unknown). Patient is postmenopausal. ? ? ?Patient has the following medical conditions:   ?Patient Active Problem List  ? Diagnosis Date Noted  ? Obesity 03/16/2021  ? Lipoma of left axilla 03/16/2021  ? Lipoma of right axilla 03/16/2021  ? Xiphoid pain 03/16/2021  ? Alcohol abuse (1 bottle wine qoday) 02/15/2020  ? Postmenopausal 05/04/2019  ? Neuropathy 06/11/2017  ? Anemia 12/25/2016  ? Pap smear abnormality of cervix/human papillomavirus (HPV) positive 05/28/2016  ? Acanthosis nigricans 05/22/2016  ? GERD (gastroesophageal reflux disease) 05/10/2015  ? ? ?Chief Complaint  ?Patient presents with  ? SEXUALLY TRANSMITTED DISEASE  ?  STD screening. Endorses vaginal itching   ? ? ?HPI ? ?Patient reports here for screening, has s/sx ? ?Last HIV test per patient/review of record was 12/28/2020 ?Patient reports last pap was 05/2017.  ? ?Screening for MPX risk: ?Does the patient have an unexplained rash? No ?Is the patient MSM? No ?Does the patient endorse multiple sex partners or anonymous sex partners? No ?Did the patient have close or sexual contact with a person diagnosed with MPX? No ?Has the patient traveled outside the [place] where MPX is endemic? No ?Is there a high clinical suspicion for MPX-- evidenced by one of the following No ? -Unlikely to be chickenpox ? -Lymphadenopathy ? -Rash that present in same phase of evolution on any given body part ?See flowsheet for further details and programmatic requirements.  ? ? ?The following portions of the patient's history were reviewed and  updated as appropriate: allergies, current medications, past medical history, past social history, past surgical history and problem list. ? ?Objective:  ?There were no vitals filed for this visit. ? ?Physical Exam ? ? ?Assessment and Plan:  ?Nancy Mcdowell is a 56 y.o. female presenting to the [facility] Department for STI screening ? ?1. Screening examination for venereal disease ?Patient accepted all screenings including wet prep, vaginal CT/GC and bloodwork for HIV/RPR.  ?Patient meets criteria for HepB screening? Yes. Ordered? No - declined  ?Patient meets criteria for HepC screening? Yes. Ordered? No - declined ? ?Wet prep results neg    ?Treatment needed  ?Discussed time line for [facility] Lab results and that patient will be called with positive results and encouraged patient to call if she had not heard in 2 weeks.  ?Counseled to return or seek care for continued or worsening symptoms ?Recommended condom use with all sex ? ?Patient is currently using  menopause  to prevent pregnancy.   ?- Chlamydia/Gonorrhea [place] [facility] ?- HIV [place] [facility] ?- WET PREP FOR TRICH, YEAST, CLUE ?- Syphilis Serology, [place] [facility] ? ?2. Yeast vaginitis ?Yeast present on assessment  ?- clotrimazole (V-R CLOTRIMAZOLE VAGINAL) 1 % vaginal cream; Place 1 Applicatorful vaginally at bedtime for 7 days.  Dispense: 45 g; Refill: 0 ? ? ? ? ?No follow-ups on file. ? ?Future Appointments  ?Date Time Provider [place]  ?05/04/2022  8:00 AM Cannady, Barbaraann Faster, NP [facility]  ? ? ?Junious Dresser, FNP ?

## 2021-08-11 NOTE — Progress Notes (Signed)
Patient here for STD clinic. Medication dispensed per providers orders. ?

## 2021-08-14 LAB — WET PREP FOR TRICH, YEAST, CLUE
Trichomonas Exam: NEGATIVE
Yeast Exam: NEGATIVE

## 2021-08-29 ENCOUNTER — Ambulatory Visit (INDEPENDENT_AMBULATORY_CARE_PROVIDER_SITE_OTHER): Payer: 59 | Admitting: Unknown Physician Specialty

## 2021-08-29 ENCOUNTER — Encounter: Payer: Self-pay | Admitting: Unknown Physician Specialty

## 2021-08-29 VITALS — BP 125/83 | HR 68 | Temp 98.4°F | Ht 62.5 in | Wt 207.6 lb

## 2021-08-29 DIAGNOSIS — S161XXA Strain of muscle, fascia and tendon at neck level, initial encounter: Secondary | ICD-10-CM | POA: Diagnosis not present

## 2021-08-29 MED ORDER — CYCLOBENZAPRINE HCL 10 MG PO TABS: 10.0000 mg | ORAL_TABLET | Freq: Every day | ORAL | 0 refills | Status: DC

## 2021-08-29 NOTE — Patient Instructions (Signed)
Dr. Milta Deiters and Dice ?

## 2021-08-29 NOTE — Progress Notes (Signed)
? ?BP 125/83   Pulse 68   Temp 98.4 ?F (36.9 ?C) (Oral)   Ht 5' 2.5" (1.588 m)   Wt 207 lb 9.6 oz (94.2 kg)   LMP  (LMP Unknown) Comment: 3 years ago  SpO2 98%   BMI 37.37 kg/m?   ? ?Subjective:  ? ? Patient ID: Nancy Mcdowell, female    DOB: Apr 25, 1966, 56 y.o.   MRN: [id number] ? ?HPI: ?Nancy Mcdowell is a 56 y.o. female ? ?Chief Complaint  ?Patient presents with  ? Spasms  ?  Patient is here for muscle spasms. Patient says about 2 Saturdays ago, she has been having muscle spasms in her neck. Patient states when they come she is unable to move her neck. Patient states the spasms are coming and going. Patient states she has tried Oxycodone, Tylenol, Advil, Gabapentin and warm compress to the area. Patient states her co-worker performed massages and techniques to the area and it helped a little bit.  ? ?Pt has muscle spasms which come and go.  They often come suddenly and lasts for 3-4 minutes and sometimes wakes up with them and they will stay with her for a little while and they go away.  Not aggravated by any movement.  Points to the left back of neck as location for spasms.  Had some Oxycodone left over from surgery and Gabapentin for foot neuropathy but stopped.  These episodes started 1 1/2 weeks ago. No numbness or tingling of extremities except for 2 days ago but it resolved ? ? ?Relevant past medical, surgical, family and social history reviewed and updated as indicated. Interim medical history since our last visit reviewed. ?Allergies and medications reviewed and updated. ? ?Review of Systems  ?Constitutional: Negative.   ?Respiratory: Negative.    ?Cardiovascular: Negative.   ? ?Per HPI unless specifically indicated above ? ?   ?Objective:  ?  ?BP 125/83   Pulse 68   Temp 98.4 ?F (36.9 ?C) (Oral)   Ht 5' 2.5" (1.588 m)   Wt 207 lb 9.6 oz (94.2 kg)   LMP  (LMP Unknown) Comment: 3 years ago  SpO2 98%   BMI 37.37 kg/m?   ?Wt Readings from Last 3 Encounters:  ?08/29/21 207 lb 9.6 oz (94.2 kg)   ?06/09/21 205 lb 12.8 oz (93.4 kg)  ?05/25/21 207 lb (93.9 kg)  ?  ?Physical Exam ?Constitutional:   ?   General: She is not in acute distress. ?   Appearance: Normal appearance. She is well-developed.  ?HENT:  ?   Head: Normocephalic and atraumatic.  ?Eyes:  ?   General: Lids are normal. No scleral icterus.    ?   Right eye: No discharge.     ?   Left eye: No discharge.  ?   Conjunctiva/sclera: Conjunctivae normal.  ?Neck:  ?   Vascular: No carotid bruit or JVD.  ?   Comments: Tender left sternomastoid with muscle firmness noted ?Cardiovascular:  ?   Rate and Rhythm: Normal rate and regular rhythm.  ?   Heart sounds: Normal heart sounds.  ?Pulmonary:  ?   Effort: Pulmonary effort is normal. No respiratory distress.  ?Abdominal:  ?   Palpations: There is no hepatomegaly or splenomegaly.  ?Musculoskeletal:     ?   General: Normal range of motion.  ?   Cervical back: Normal range of motion and neck supple.  ?Skin: ?   General: Skin is warm and dry.  ?   Coloration: Skin  is not pale.  ?   Findings: No rash.  ?Neurological:  ?   Mental Status: She is alert and oriented to person, place, and time.  ?Psychiatric:     ?   Behavior: Behavior normal.     ?   Thought Content: Thought content normal.     ?   Judgment: Judgment normal.  ? ? ?Results for orders placed or performed in visit on 08/22/21  ?[facility] HIV SCREENING LAB  ?Result Value Ref Range  ? [facility] HIV Screening Negative - Validated   ? ?   ?Assessment & Plan:  ? ?Problem List Items Addressed This Visit   ?None ?Visit Diagnoses   ? ? Strain of sternocleidomastoid muscle, initial encounter    -  Primary  ? Will rx Flexeril to take QHS and will cut in half if it makes her fatugued.  Discussed manual DTR techniques with chiropractor or Dr. Wynetta Emery  ? ?  ?  ? ?Follow up plan: ?Return if symptoms worsen or fail to improve. ? ? ? ? ? ?

## 2021-09-29 ENCOUNTER — Ambulatory Visit (INDEPENDENT_AMBULATORY_CARE_PROVIDER_SITE_OTHER): Payer: 59 | Admitting: Nurse Practitioner

## 2021-09-29 ENCOUNTER — Other Ambulatory Visit: Payer: Self-pay | Admitting: Nurse Practitioner

## 2021-09-29 ENCOUNTER — Encounter: Payer: Self-pay | Admitting: Nurse Practitioner

## 2021-09-29 VITALS — BP 111/73 | HR 64 | Temp 98.7°F | Wt 207.6 lb

## 2021-09-29 DIAGNOSIS — N6011 Diffuse cystic mastopathy of right breast: Secondary | ICD-10-CM

## 2021-09-29 DIAGNOSIS — N6012 Diffuse cystic mastopathy of left breast: Secondary | ICD-10-CM | POA: Diagnosis not present

## 2021-09-29 NOTE — Progress Notes (Signed)
BP 111/73   Pulse 64   Temp 98.7 F (37.1 C) (Oral)   Wt 207 lb 9.6 oz (94.2 kg)   LMP  (LMP Unknown) Comment: 3 years ago  SpO2 98%   BMI 37.37 kg/m    Subjective:    Patient ID: Nancy Mcdowell, female    DOB: 1966-05-02, 56 y.o.   MRN: [id number]  HPI: Nancy Mcdowell is a 56 y.o. female  Chief Complaint  Patient presents with   Breast Problem    Patient states last Friday, her breast felt really funny and she checked her breast Sunday. Patient states she feels something on the inner section of both breast. Patient says it feels like a lump and she said she has been told she has lumpy breast. Patient denies checking her breast monthly. She checks it every so often. Patient has family history of Breast Cancer.     BREAST MASS Last Friday she felt sore breasts. Felt her breasts and she felt they were different and noticed this yesterday too.  Noticed this to both sides more inside areas.  States kind of like a lump to areas.  Does drink sodas drank one a day last week.    Last mammogram on 04/18/21 with no masses noted, plus had u/s left and right axilla.  Does have family history of breast cancer: maternal aunt and a paternal great aunt + 1st cousin on maternal side (daughter of aunt who passed from breast cancer). Duration :days Location: bilateral Onset: sudden Redness: no Swelling: no Trauma: no trauma Breastfeeding: no Associated with menstral cycle: no -- has not had one in 3-4 years Nipple discharge: no Breast lump: yes Status: fluctuating Treatments attempted: none Previous mammogram: yes   Relevant past medical, surgical, family and social history reviewed and updated as indicated. Interim medical history since our last visit reviewed. Allergies and medications reviewed and updated.  Review of Systems  Constitutional:  Negative for activity change, appetite change, diaphoresis, fatigue and fever.  Respiratory:  Negative for cough, chest tightness and  shortness of breath.   Cardiovascular:  Negative for chest pain, palpitations and leg swelling.  Psychiatric/Behavioral: Negative.     Per HPI unless specifically indicated above     Objective:    BP 111/73   Pulse 64   Temp 98.7 F (37.1 C) (Oral)   Wt 207 lb 9.6 oz (94.2 kg)   LMP  (LMP Unknown) Comment: 3 years ago  SpO2 98%   BMI 37.37 kg/m   Wt Readings from Last 3 Encounters:  09/29/21 207 lb 9.6 oz (94.2 kg)  08/29/21 207 lb 9.6 oz (94.2 kg)  06/09/21 205 lb 12.8 oz (93.4 kg)    Physical Exam Vitals and nursing note reviewed. Exam conducted with a chaperone present.  Constitutional:      General: She is awake. She is not in acute distress.    Appearance: She is well-developed and well-groomed. She is obese. She is not ill-appearing or toxic-appearing.  HENT:     Head: Normocephalic.     Right Ear: Hearing normal.     Left Ear: Hearing normal.  Eyes:     General: Lids are normal.        Right eye: No discharge.        Left eye: No discharge.     Conjunctiva/sclera: Conjunctivae normal.     Pupils: Pupils are equal, round, and reactive to light.  Neck:     Thyroid: No thyromegaly.  Vascular: No carotid bruit.  Cardiovascular:     Rate and Rhythm: Normal rate and regular rhythm.     Heart sounds: Normal heart sounds. No murmur heard.   No gallop.  Pulmonary:     Effort: Pulmonary effort is normal. No accessory muscle usage.     Breath sounds: Normal breath sounds.  Chest:  Breasts:    Right: Mass present. No swelling, nipple discharge, skin change or tenderness.     Left: No swelling, mass, nipple discharge, skin change or tenderness.       Comments: Denser tissue bilaterally breasts, specifically to 5-6 o'clock area. Abdominal:     General: Bowel sounds are normal.     Palpations: Abdomen is soft. There is no hepatomegaly or splenomegaly.  Musculoskeletal:     Cervical back: Normal range of motion and neck supple.     Right lower leg: No edema.      Left lower leg: No edema.  Lymphadenopathy:     Upper Body:     Right upper body: No supraclavicular, axillary or pectoral adenopathy.     Left upper body: No supraclavicular, axillary or pectoral adenopathy.  Skin:    General: Skin is warm and dry.  Neurological:     Mental Status: She is alert and oriented to person, place, and time.  Psychiatric:        Attention and Perception: Attention normal.        Mood and Affect: Mood normal.        Speech: Speech normal.        Behavior: Behavior normal. Behavior is cooperative.        Thought Content: Thought content normal.    Results for orders placed or performed in visit on 08/22/21  [facility] SCREENING LAB  Result Value Ref Range   HM HIV Screening Negative - Validated       Assessment & Plan:   Problem List Items Addressed This Visit       Other   Bilateral fibrocystic breast changes - Primary    Noted on exam with one small mobile mass area noted to 5 o'clock right breast, however denser tissue to this area both breasts.  Will get diagnostic mammogram to further assess areas and bring back to office as needed if abnormal findings.  She will call [facility] to schedule this imaging.  Recommend reduce caffeine intake and drink more water.       Relevant Orders   MM DIAG BREAST TOMO BILATERAL     Follow up plan: Return for as scheduled in December -- sooner if needed.

## 2021-09-29 NOTE — Assessment & Plan Note (Signed)
Noted on exam with one small mobile mass area noted to 5 o'clock right breast, however denser tissue to this area both breasts.  Will get diagnostic mammogram to further assess areas and bring back to office as needed if abnormal findings.  She will call [facility] to schedule this imaging.  Recommend reduce caffeine intake and drink more water.

## 2021-09-29 NOTE — Patient Instructions (Signed)
Please call to schedule your mammogram and/or bone density: [facility] [place]: [address], [place], [place] [address] Phone: [phone]   Breast Self-Awareness Breast self-awareness means being familiar with how your breasts look and feel. It involves checking your breasts regularly and telling your health care provider about any changes. Practicing breast self-awareness helps to maintain breast health. Sometimes, changes are not harmful (are benign). Other times, a change in your breasts can be a sign of a serious medical problem. Being familiar with the look and feel of your breasts can help you catch a breast problem while it is still small and can be treated. You should do breast self-exams even if you have breast implants. What you need: A mirror. A well-lit room. A pillow or other soft object. How to do a breast self-exam A breast self-exam is one way to learn what is normal for your breasts and whether your breasts are changing. To do a breast self-exam: Look for changes  Remove all the clothing above your waist. Stand in front of a mirror in a room with good lighting. Put your hands down at your sides. Compare your breasts in the mirror. Look for differences between them (asymmetry), such as: Differences in shape. Differences in size. Puckers, dips, and bumps in one breast and not the other. Look at each breast for changes in the skin, such as: Redness. Scaly areas. Skin thickening. Dimpling. Open sores (ulcers). Look for changes in your nipples, such as: Discharge. Bleeding. Dimpling. Redness. A nipple that looks pushed in (retracted), or that has changed position. Feel for changes Carefully feel your breasts for lumps and changes. It is best to do this self-exam while lying down. Follow these steps to feel each breast: Place a pillow under the shoulder of one side of your body. Place the arm of that side of your body  behind your head. Feel the breast of that side of your body using the hand of the opposite arm. To do this: Start in the nipple area and use the pads of your three middle fingers to make -inch (2 cm) overlapping circles. Use light, medium, and then firm pressure as you feel your breast, gently covering the entire breast area and armpit. Continue the overlapping circles, moving downward over the breast until you feel your ribs below your breast. Then, make circles with your fingers going upward until you reach your collarbone. Next, make circles by moving outward across your breast and into your armpit area. Squeeze the nipple. Check for discharge and lumps. Repeat steps 1-7 to check your other breast. Sit or stand in the tub or shower. With soapy water on your skin, feel each breast the same way you did when you were lying down. Write down what you find Writing down what you find can help you remember what to discuss with your health care provider. Write down: What is normal for each breast. Any changes that you find in each breast. These include: The kind of changes you find. Any pain or tenderness. Size and location of any lumps. Where you are in your menstrual cycle, if you are still getting your menstrual period (menstruating). General tips If you are breastfeeding, the best time to examine your breasts is after a feeding or after using a breast pump. If you menstruate, the best time to examine your breasts is 5-7 days after your menstrual period. Breasts are generally lumpier during menstrual periods, and it may  be more difficult to notice changes. With time and practice, you will become more familiar with the differences in your breasts and more comfortable with the exam. Contact a health care provider if: You see a change in the shape or size of your breasts or nipples. You see a change in the skin of your breast or nipples, such as a reddened or scaly area. You have unusual  discharge from your nipples. You find a new lump or thick area. You have breast pain. You have any concerns about your breast health. Summary Breast self-awareness includes looking for physical changes in your breasts and feeling for any changes within your breasts. Breast self-awareness should be done in front of a mirror in a well-lit room. If you menstruate, the best time to examine your breasts is 5-7 days after your menstrual period. Tell your health care provider about any changes you notice in your breasts. Changes include changes in size, changes on the skin, pain or tenderness, or unusual fluid from your nipples. This information is not intended to replace advice given to you by your health care provider. Make sure you discuss any questions you have with your health care provider. Document Revised: 03/21/2021 Document Reviewed: 03/02/2021 Elsevier Patient Education  [place].

## 2021-10-20 ENCOUNTER — Ambulatory Visit
Admission: RE | Admit: 2021-10-20 | Discharge: 2021-10-20 | Disposition: A | Discharge status: Home / Self Care | Payer: 59 | Source: Ambulatory Visit | Attending: Nurse Practitioner | Admitting: Nurse Practitioner

## 2021-10-20 DIAGNOSIS — N6011 Diffuse cystic mastopathy of right breast: Secondary | ICD-10-CM | POA: Diagnosis present

## 2021-10-20 DIAGNOSIS — N6012 Diffuse cystic mastopathy of left breast: Secondary | ICD-10-CM | POA: Diagnosis present

## 2021-10-21 NOTE — Progress Notes (Signed)
Contacted via [place] [place], your imaging has returned.  Overall breast imaging is normal with no masses or cysts -- there is some normal dense (thick) tissue at the areas of concern.  This can sometimes become more prominent during cycles or if you are drinking a lot of caffeine.  Any questions? Keep being stellar!!  Thank you for allowing me to participate in your care.  I appreciate you. Kindest regards, Malcolm Quast

## 2021-12-06 ENCOUNTER — Ambulatory Visit: Payer: Self-pay | Admitting: Nurse Practitioner

## 2021-12-06 ENCOUNTER — Encounter: Payer: Self-pay | Admitting: Nurse Practitioner

## 2021-12-06 DIAGNOSIS — Z113 Encounter for screening for infections with a predominantly sexual mode of transmission: Secondary | ICD-10-CM

## 2021-12-06 DIAGNOSIS — B9689 Other specified bacterial agents as the cause of diseases classified elsewhere: Secondary | ICD-10-CM

## 2021-12-06 DIAGNOSIS — N760 Acute vaginitis: Secondary | ICD-10-CM

## 2021-12-06 DIAGNOSIS — N898 Other specified noninflammatory disorders of vagina: Secondary | ICD-10-CM

## 2021-12-06 LAB — WET PREP FOR TRICH, YEAST, CLUE
Trichomonas Exam: NEGATIVE
Yeast Exam: NEGATIVE

## 2021-12-06 LAB — HM HEPATITIS C SCREENING LAB: HM Hepatitis Screen: NEGATIVE

## 2021-12-06 LAB — HEPATITIS B SURFACE ANTIGEN: Hepatitis B Surface Ag: NONREACTIVE

## 2021-12-06 LAB — HM HIV SCREENING LAB: HM HIV Screening: NEGATIVE

## 2021-12-06 MED ORDER — CLOTRIMAZOLE 3 2 % VA CREA: 1.0000 | TOPICAL_CREAM | Freq: Every day | VAGINAL | 0 refills | Status: DC

## 2021-12-06 MED ORDER — METRONIDAZOLE 500 MG PO TABS: 500.0000 mg | ORAL_TABLET | Freq: Two times a day (BID) | ORAL | 0 refills | Status: DC

## 2021-12-06 NOTE — Progress Notes (Unsigned)
[facility] Department  STI clinic/screening visit [place] [address] [phone]  Subjective:  Nancy Mcdowell is a 56 y.o. female being seen today for an STI screening visit. The patient reports they do have symptoms.  Patient reports that they do not desire a pregnancy in the next year.   They reported they are not interested in discussing contraception today.  Patient is postmenopausal.    No LMP recorded (lmp unknown). Patient is postmenopausal.   Patient has the following medical conditions:   Patient Active Problem List   Diagnosis Date Noted   Bilateral fibrocystic breast changes 09/29/2021   Obesity 03/16/2021   Lipoma of left axilla 03/16/2021   Lipoma of right axilla 03/16/2021   Xiphoid pain 03/16/2021   Alcohol abuse (1 bottle wine qoday) 02/15/2020   Postmenopausal 05/04/2019   Neuropathy 06/11/2017   Anemia 12/25/2016   Pap smear abnormality of cervix/human papillomavirus (HPV) positive 05/28/2016   Acanthosis nigricans 05/22/2016   GERD (gastroesophageal reflux disease) 05/10/2015    Chief Complaint  Patient presents with   SEXUALLY TRANSMITTED DISEASE    Screening patient is having vaginal discomfort, itching and odor     HPI  Patient reports to clinic today for STD screening.  Patient reports some genital itching, vaginal itching, and rash that began 2-3 months ago.   Last HIV test per patient/review of record was 08/11/21. Patient reports last pap was 06/12/2017.   Screening for MPX risk: Does the patient have an unexplained rash? No Is the patient MSM? No Does the patient endorse multiple sex partners or anonymous sex partners? No Did the patient have close or sexual contact with a person diagnosed with MPX? No Has the patient traveled outside the [place] where MPX is endemic? No Is there a high clinical suspicion for MPX-- evidenced by one of the following No  -Unlikely to be  chickenpox  -Lymphadenopathy  -Rash that present in same phase of evolution on any given body part See flowsheet for further details and programmatic requirements.   Immunization history:  Immunization History  Administered Date(s) Administered   Hepatitis A 03/03/2007   Hepatitis B 03/03/2007   Td 03/24/2007   Tdap 03/24/2007, 11/04/2009     The following portions of the patient's history were reviewed and updated as appropriate: allergies, current medications, past medical history, past social history, past surgical history and problem list.  Objective:  There were no vitals filed for this visit.  Physical Exam Constitutional:      Appearance: Normal appearance.  HENT:     Head: Normocephalic. No abrasion, masses or laceration. Hair is normal.     Right Ear: External ear normal.     Left Ear: External ear normal.     Nose: Nose normal.     Mouth/Throat:     Lips: Pink.     Mouth: Mucous membranes are moist. No oral lesions.     Pharynx: No oropharyngeal exudate or posterior oropharyngeal erythema.     Tonsils: No tonsillar exudate or tonsillar abscesses.     Comments: Poor dentition Eyes:     General: Lids are normal.        Right eye: No discharge.        Left eye: No discharge.     Conjunctiva/sclera: Conjunctivae normal.     Right eye: No exudate.    Left eye: No exudate. Abdominal:     General: Abdomen is flat.     Palpations: Abdomen is  soft.     Tenderness: There is no abdominal tenderness. There is no rebound.  Genitourinary:    Pubic Area: No rash or pubic lice.      Labia:        Right: No rash, tenderness, lesion or injury.        Left: No rash, tenderness, lesion or injury.      Vagina: Normal. No vaginal discharge, erythema or lesions.     Cervix: No cervical motion tenderness, discharge, lesion or erythema.     Uterus: Not enlarged and not tender.      Rectum: Normal.     Comments: Amount Discharge: small  Odor: No pH: less than 4.5 Adheres to  vaginal wall: No Color: color of discharge matches the Nancy Mcdowell   Irritation noted in the vaginal folds  Musculoskeletal:     Cervical back: Full passive range of motion without pain, normal range of motion and neck supple.  Lymphadenopathy:     Cervical: No cervical adenopathy.     Right cervical: No superficial, deep or posterior cervical adenopathy.    Left cervical: No superficial, deep or posterior cervical adenopathy.     Upper Body:     Right upper body: No supraclavicular, axillary or epitrochlear adenopathy.     Left upper body: No supraclavicular, axillary or epitrochlear adenopathy.     Lower Body: No right inguinal adenopathy. No left inguinal adenopathy.  Skin:    General: Skin is warm and dry.     Findings: No lesion or rash.  Neurological:     Mental Status: She is alert and oriented to person, place, and time.  Psychiatric:        Attention and Perception: Attention normal.        Mood and Affect: Mood normal.        Speech: Speech normal.        Behavior: Behavior normal. Behavior is cooperative.      Assessment and Plan:  Nancy Mcdowell is a 56 y.o. female presenting to the [facility] Department for STI screening  1. Screening examination for venereal disease -56 year old female in clinic today for STD screening. -Patient accepted all screenings including vaginal CT/GC and bloodwork for HIV/RPR.  Patient meets criteria for HepB screening? Yes. Ordered? Yes Patient meets criteria for HepC screening? Yes. Ordered? Yes  Treat wet prep per standing order Discussed time line for [facility] results and that patient will be called with positive results and encouraged patient to call if she had not heard in 2 weeks.  Counseled to return or seek care for continued or worsening symptoms Recommended condom use with all sex  Patient is currently not using  contraception  to prevent pregnancy.  Patient is postmenopausal.    - HIV/HCV [place] - Syphilis  Serology, [place] - HBV Antigen/Antibody [facility] - Chlamydia/Gonorrhea [place] [facility] - WET PREP FOR [place], YEAST, CLUE  2. Vaginal irritation -Please provide patient with Clotrimazole for vaginal irritation.  Apply to vaginal folds.   - clotrimazole (CLOTRIMAZOLE 3) 2 % vaginal cream; Place 1 Applicatorful vaginally at bedtime.  Dispense: 21 g; Refill: 0    3. BV (bacterial vaginosis) -Wet prep reviewed, please treat for BV. -Steps to prevent BV:  Wear all-cotton underwear Sleep without underwear Take showers instead of baths Wear loose fitting clothing, especially during warm/hot weather Use a hair dryer on low after bathing to dry the area Avoid scented soaps and body  washes Do not douche May try over the counter probiotics or boric acid gel or suppositories Stop smoking   - metroNIDAZOLE (FLAGYL) 500 MG tablet; Take 1 tablet (500 mg total) by mouth 2 (two) times daily.  Dispense: 14 tablet; Refill: 0   Return if symptoms worsen or fail to improve.  Total time spent: 30 minutes  Gregary Cromer, FNP

## 2021-12-06 NOTE — Patient Instructions (Signed)
Steps to prevent BV and yeast: Wear all-cotton underwear Sleep without underwear Take showers instead of baths Wear loose fitting clothing, especially during warm/hot weather Use a hair dryer on low after bathing to dry the area Avoid scented soaps and body washes Do not douche May try over the counter probiotics or boric acid gel or suppositories Stop smoking

## 2021-12-06 NOTE — Progress Notes (Unsigned)
Pt here for STD screening.  Wet mount results reviewed.  The patient was dispensed Metronidazole 500 mg #14 and Clotrimazole 1% vaginal cream today. I provided counseling today regarding the medication. We discussed the medication, the side effects and when to call clinic. Patient given the opportunity to ask questions. Questions answered.  Condoms given.  Windle Guard, RN

## 2022-05-01 DIAGNOSIS — E7800 Pure hypercholesterolemia, unspecified: Secondary | ICD-10-CM | POA: Insufficient documentation

## 2022-05-01 NOTE — Patient Instructions (Addendum)
Alopecia Areata, Adult  Alopecia areata is a condition that causes hair loss. A person with this condition may lose hair on the scalp in patches. In some cases, a person may lose all the hair on the scalp or all the hair from the face and body. Having this condition can be emotionally difficult, but it is not dangerous. Alopecia areata is an autoimmune disease. This means that your body's defense system (immune system) mistakes normal parts of the body for germs or other things that can make you sick. When you have alopecia areata, the immune system attacks the hair follicles. What are the causes? The cause of this condition is not known. What increases the risk? You are more likely to develop this condition if you have: A family history of alopecia. A family history of another autoimmune disease, including type 1 diabetes and thyroid autoimmune disease. Eczema, asthma, and allergies. Down syndrome. What are the signs or symptoms? The main symptom of this condition is round spots of patchy hair loss on the scalp. The spots may be mildly itchy. Other symptoms include: Short dark hairs in the bald patches that are wider at the top (exclamation point hairs). Dents, white spots, or lines in the fingernails or toenails. Balding and body hair loss. This is rare. Alopecia areata usually develops in childhood, but it can develop at any age. For some people, their hair grows back on its own and hair loss does not happen again. For others, their hair may fall out and grow back in cycles. The hair loss may last many years. How is this diagnosed? This condition is diagnosed based on your symptoms and family history. Your health care provider will also check your scalp skin, teeth, and nails. Your health care provider may refer you to a specialist in hair and skin disorders (dermatologist). You may also have tests, including: A hair pull test. Blood tests or other screening tests to check for autoimmune  diseases, such as thyroid disease or diabetes. Skin biopsy to confirm the diagnosis. A procedure to examine the skin with a lighted magnifying instrument (dermoscopy). How is this treated? There is no cure for alopecia areata. The goals of treatment are to promote the regrowth of hair and prevent the immune system from overreacting. No single treatment is right for all people with alopecia areata. It depends on the type of hair loss you have and how severe it is. Work with your health care provider to find the best treatment for you. Treatment may include: Regular checkups to make sure the condition is not getting worse . This is called watchful waiting. Using steroid creams or pills for 6-8 weeks to stop the immune reaction and help hair to regrow more quickly. Using other medicines on your skin (topical medicines) to change the immune system response and support the hair growth cycle. Steroid injections. Therapy and counseling with a support group or therapist if you are having trouble coping with hair loss. Follow these instructions at home: Medicines Apply topical creams only as told by your health care provider. Take over-the-counter and prescription medicines only as told by your health care provider. General instructions Learn as much as you can about your condition. Consider getting a wig or products to make hair look fuller or to cover bald spots, if you feel uncomfortable with your appearance. Get therapy or counseling if you are having a hard time coping with hair loss. Ask your health care provider to recommend a counselor or support group. Keep   all follow-up visits as told by your health care provider. This is important. Where to find more information National Alopecia Areata Foundation: naaf.org Contact a health care provider if: Your hair loss gets worse, even with treatment. You have new symptoms. You are struggling emotionally. Get help right away if: You have a sudden  worsening of the hair loss. Summary Alopecia areata is an autoimmune condition that makes your body's defense system (immune system) attack the hair follicles. This causes you to lose hair. Having this condition can be emotionally difficult, but it is not dangerous. Treatments may include regular checkups to make sure that the condition is not getting worse, medicines, and steroid injections. This information is not intended to replace advice given to you by your health care provider. Make sure you discuss any questions you have with your health care provider. Document Revised: 07/14/2019 Document Reviewed: 07/14/2019 Elsevier Patient Education  2023 [employer]  

## 2022-05-04 ENCOUNTER — Encounter: Payer: Self-pay | Admitting: Nurse Practitioner

## 2022-05-04 ENCOUNTER — Other Ambulatory Visit (HOSPITAL_COMMUNITY)
Admission: RE | Admit: 2022-05-04 | Discharge: 2022-05-04 | Disposition: A | Discharge status: Home / Self Care | Payer: 59 | Source: Ambulatory Visit | Attending: Nurse Practitioner | Admitting: Nurse Practitioner

## 2022-05-04 ENCOUNTER — Ambulatory Visit (INDEPENDENT_AMBULATORY_CARE_PROVIDER_SITE_OTHER): Payer: 59 | Admitting: Nurse Practitioner

## 2022-05-04 VITALS — BP 117/81 | HR 72 | Temp 98.4°F | Ht 62.52 in | Wt 197.4 lb

## 2022-05-04 DIAGNOSIS — Z6835 Body mass index (BMI) 35.0-35.9, adult: Secondary | ICD-10-CM | POA: Diagnosis not present

## 2022-05-04 DIAGNOSIS — Z1211 Encounter for screening for malignant neoplasm of colon: Secondary | ICD-10-CM | POA: Diagnosis not present

## 2022-05-04 DIAGNOSIS — E6609 Other obesity due to excess calories: Secondary | ICD-10-CM

## 2022-05-04 DIAGNOSIS — E7800 Pure hypercholesterolemia, unspecified: Secondary | ICD-10-CM

## 2022-05-04 DIAGNOSIS — Z23 Encounter for immunization: Secondary | ICD-10-CM | POA: Diagnosis not present

## 2022-05-04 DIAGNOSIS — Z0000 Encounter for general adult medical examination without abnormal findings: Secondary | ICD-10-CM

## 2022-05-04 DIAGNOSIS — Z124 Encounter for screening for malignant neoplasm of cervix: Secondary | ICD-10-CM

## 2022-05-04 DIAGNOSIS — Z8742 Personal history of other diseases of the female genital tract: Secondary | ICD-10-CM | POA: Insufficient documentation

## 2022-05-04 DIAGNOSIS — G629 Polyneuropathy, unspecified: Secondary | ICD-10-CM

## 2022-05-04 DIAGNOSIS — K219 Gastro-esophageal reflux disease without esophagitis: Secondary | ICD-10-CM | POA: Diagnosis not present

## 2022-05-04 DIAGNOSIS — L659 Nonscarring hair loss, unspecified: Secondary | ICD-10-CM

## 2022-05-04 DIAGNOSIS — F1010 Alcohol abuse, uncomplicated: Secondary | ICD-10-CM

## 2022-05-04 DIAGNOSIS — E66812 Obesity, class 2: Secondary | ICD-10-CM

## 2022-05-04 MED ORDER — MINOXIDIL 5 % EX SOLN: 1.0000 "application " | Freq: Every evening | CUTANEOUS | 4 refills | Status: AC

## 2022-05-04 NOTE — Assessment & Plan Note (Signed)
Noted on past labs -- will recheck today and continue focus on diet and exercise.

## 2022-05-04 NOTE — Assessment & Plan Note (Signed)
Chronic, stable with minimal use of Protonix, continue this as needed only and focus on diet changes.  Mag level today.

## 2022-05-04 NOTE — Progress Notes (Signed)
BP 117/81   Pulse 72   Temp 98.4 F (36.9 C) (Oral)   Ht 5' 2.52" (1.588 m)   Wt 197 lb 6.4 oz (89.5 kg)   LMP  (LMP Unknown) Comment: 3 years ago  SpO2 98%   BMI 35.51 kg/m    Subjective:    Patient ID: Nancy Mcdowell, female    DOB: 01-Aug-1965, 56 y.o.   MRN: [id number]  HPI: Nancy Mcdowell is a 56 y.o. female presenting on 05/04/2022 for comprehensive medical examination. Current medical complaints include:none  She currently lives with: brother Menopausal Symptoms: no  The 10-year ASCVD risk score (Arnett DK, et al., 2019) is: 2.7%   Values used to calculate the score:     Age: 56 years     Sex: Female     Is Non-Hispanic African American: Yes     Diabetic: No     Tobacco smoker: No     Systolic Blood Pressure: 892 mmHg     Is BP treated: No     HDL Cholesterol: 45 mg/dL     Total Cholesterol: 203 mg/dL   HAIR LOSS: Has had some thinning of hair and loss since April -- has continued to have loss.  Prior to this hair was thick.  Has been crying over this and upset. She is post menopausal, has not had cycle in over 4 years.  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 June labs noted TSH 1.49 and Vit D 27, then August TSH 1.48 and Vit D improved at 37.  Has not taken supplements since Wednesday morning.    GERD Takes Pantoprazole only as needed. GERD control status: stable Satisfied with current treatment? yes Heartburn frequency: minimal Medication side effects: no  Medication compliance: stable Dysphagia: no Odynophagia:  no Hematemesis: no Blood in stool: no EGD: no      05/04/2022    8:29 AM 09/29/2021    9:48 AM 05/03/2021    1:58 PM 03/16/2021    9:51 AM 05/22/2016    2:06 PM  Depression screen PHQ 2/9  Decreased Interest 1 0 0 0 0  Down, Depressed, Hopeless 1 0 0 0 0  PHQ - 2 Score 2 0 0 0 0  Altered sleeping 1 0 0 0   Tired, decreased energy 0 0 2 1   Change in appetite 0 0 0 0    Feeling bad or failure about yourself  0 0 0 0   Trouble concentrating 0 0 0 0   Moving slowly or fidgety/restless 0 0 0 0   Suicidal thoughts 0 0 0 0   PHQ-9 Score 3 0 2 1   Difficult doing work/chores Not difficult at all   Not difficult at all          03/16/2021    9:49 AM 04/25/2021   12:21 PM 05/03/2021    2:20 PM 09/29/2021    9:48 AM 05/04/2022    8:29 AM  Fall Risk  Falls in the past year? 0  0 0 0  Was there an injury with Fall? 0  0 0 0  Fall Risk Category Calculator 0  0 0 0  Fall Risk Category Low  Low Low Low  Patient Fall Risk Level Low fall risk High fall risk  Low fall risk   Patient at Risk for Falls Due to No Fall Risks  History of fall(s) No Fall Risks No Fall  Risks  Fall risk Follow up Falls evaluation completed  Falls evaluation completed Falls evaluation completed Falls evaluation completed    Functional Status Survey: Is the patient deaf or have difficulty hearing?: No Does the patient have difficulty seeing, even when wearing glasses/contacts?: No Does the patient have difficulty concentrating, remembering, or making decisions?: No Does the patient have difficulty walking or climbing stairs?: No Does the patient have difficulty dressing or bathing?: No Does the patient have difficulty doing errands alone such as visiting a doctor's office or shopping?: No   Past Medical History:  Past Medical History:  Diagnosis Date   Alcohol abuse (1 bottle wine qoday) 02/15/2020   Anemia 12/25/2016   Cataract    COVID-19 03/2021   dx week of thanksgiving   GERD (gastroesophageal reflux disease)    Heart murmur    Said she was told she had one at 20   Sleep apnea    mild-was not given cpap    Surgical History:  Past Surgical History:  Procedure Laterality Date   COLONOSCOPY WITH PROPOFOL N/A 02/04/2017   Procedure: COLONOSCOPY WITH PROPOFOL;  Surgeon: [name], MD;  Location: [facility] ENDOSCOPY;  Service: Gastroenterology;  Laterality: N/A;   EYE SURGERY  Left 2010   retinal detachment   LIPOMA EXCISION Bilateral 04/25/2021   Procedure: EXCISION LIPOMA, bilateral axillary lipomas;  Surgeon: Olean Ree, MD;  Location: [facility] ORS;  Service: General;  Laterality: Bilateral;  Provider requesting 2.5 hours / 150 minutes for procedure.   RETINAL DETACHMENT SURGERY Left 2010, 2011   2    Medications:  Current Outpatient Medications on File Prior to Visit  Medication Sig   acetaminophen (TYLENOL) 500 MG tablet Take 2 tablets (1,000 mg total) by mouth every 6 (six) hours as needed for mild pain.   ibuprofen (ADVIL) 600 MG tablet Take 1 tablet (600 mg total) by mouth every 8 (eight) hours as needed for moderate pain.   pantoprazole (PROTONIX) 40 MG tablet Take 40 mg by mouth daily as needed (Acid reflux).   No current facility-administered medications on file prior to visit.    Allergies:  No Known Allergies  Social History:  Social History   Socioeconomic History   Marital status: Legally Separated    Spouse name: Not on file   Number of children: Not on file   Years of education: Not on file   Highest education level: Not on file  Occupational History   Not on file  Tobacco Use   Smoking status: Never   Smokeless tobacco: Never  Vaping Use   Vaping Use: Never used  Substance and Sexual Activity   Alcohol use: Yes    Alcohol/week: 1.0 standard drink of alcohol    Types: 1 Glasses of wine per week    Comment: glass of wine once or twice a week   Drug use: Not Currently    Types: Marijuana    Comment: last used in teens   Sexual activity: Yes    Partners: Male    Birth control/protection: Post-menopausal, Condom  Other Topics Concern   Not on file  Social History Narrative   Not on file   Social Determinants of Health   Financial Resource Strain: Low Risk  (03/16/2021)   Overall Financial Resource Strain (CARDIA)    Difficulty of Paying Living Expenses: Not hard at all  Food Insecurity: No Food Insecurity (03/16/2021)    Hunger Vital Sign    Worried About Running Out of Food in the Last Year:  Never true    Ran Out of Food in the Last Year: Never true  Transportation Needs: No Transportation Needs (03/16/2021)   PRAPARE - Hydrologist (Medical): No    Lack of Transportation (Non-Medical): No  Physical Activity: Insufficiently Active (03/16/2021)   Exercise Vital Sign    Days of Exercise per Week: 2 days    Minutes of Exercise per Session: 30 min  Stress: No Stress Concern Present (03/16/2021)   [place]    Feeling of Stress : Only a little  Social Connections: Moderately Isolated (03/16/2021)   Social Connection and Isolation Panel [NHANES]    Frequency of Communication with Friends and Family: More than three times a week    Frequency of Social Gatherings with Friends and Family: More than three times a week    Attends Religious Services: More than 4 times per year    Active Member of [employer] or Organizations: No    Attends Archivist Meetings: Never    Marital Status: Never married  Intimate Partner Violence: Not At Risk (12/06/2021)   Humiliation, Afraid, Rape, and Kick questionnaire    Fear of Current or Ex-Partner: No    Emotionally Abused: No    Physically Abused: No    Sexually Abused: No   Social History   Tobacco Use  Smoking Status Never  Smokeless Tobacco Never   Social History   Substance and Sexual Activity  Alcohol Use Yes   Alcohol/week: 1.0 standard drink of alcohol   Types: 1 Glasses of wine per week   Comment: glass of wine once or twice a week    Family History:  Family History  Problem Relation Age of Onset   Hyperlipidemia Mother    Hypertension Mother    Stroke Mother    Arthritis Mother    Kidney disease Mother    Thyroid disease Mother    Kidney disease Father    Heart attack Father    Diabetes Father    Cancer Maternal Aunt        Breast Cancer   Breast  cancer Maternal Aunt 82   Breast cancer Maternal Aunt 17   Parkinson's disease Maternal Grandmother    Heart attack Paternal Grandfather    Breast cancer Cousin     Past medical history, surgical history, medications, allergies, family history and social history reviewed with patient today and changes made to appropriate areas of the chart.   Review of Systems  Constitutional:  Negative for chills, fever and malaise/fatigue.  HENT: Negative.    Respiratory: Negative.    Cardiovascular: Negative.   Neurological: Negative.   Psychiatric/Behavioral: Negative.      All other ROS negative except what is listed above and in the HPI.      Objective:    BP 117/81   Pulse 72   Temp 98.4 F (36.9 C) (Oral)   Ht 5' 2.52" (1.588 m)   Wt 197 lb 6.4 oz (89.5 kg)   LMP  (LMP Unknown) Comment: 3 years ago  SpO2 98%   BMI 35.51 kg/m   Wt Readings from Last 3 Encounters:  05/04/22 197 lb 6.4 oz (89.5 kg)  09/29/21 207 lb 9.6 oz (94.2 kg)  08/29/21 207 lb 9.6 oz (94.2 kg)    Physical Exam Vitals and nursing note reviewed. Exam conducted with a chaperone present.  Constitutional:      General: She is awake. She is not  in acute distress.    Appearance: She is well-developed and well-groomed. She is obese. She is not ill-appearing or toxic-appearing.  HENT:     Head: Normocephalic and atraumatic.     Right Ear: Hearing, tympanic membrane, ear canal and external ear normal. No drainage.     Left Ear: Hearing, tympanic membrane, ear canal and external ear normal. No drainage.     Nose: Nose normal.     Right Sinus: No maxillary sinus tenderness or frontal sinus tenderness.     Left Sinus: No maxillary sinus tenderness or frontal sinus tenderness.     Mouth/Throat:     Mouth: Mucous membranes are moist.     Pharynx: Oropharynx is clear. Uvula midline. No pharyngeal swelling, oropharyngeal exudate or posterior oropharyngeal erythema.  Eyes:     General: Lids are normal.        Right eye:  No discharge.        Left eye: No discharge.     Extraocular Movements: Extraocular movements intact.     Conjunctiva/sclera: Conjunctivae normal.     Pupils: Pupils are equal, round, and reactive to light.     Visual Fields: Right eye visual fields normal and left eye visual fields normal.  Neck:     Thyroid: No thyromegaly.     Vascular: No carotid bruit.     Trachea: Trachea normal.  Cardiovascular:     Rate and Rhythm: Normal rate and regular rhythm.     Heart sounds: Normal heart sounds. No murmur heard.    No gallop.  Pulmonary:     Effort: Pulmonary effort is normal. No accessory muscle usage or respiratory distress.     Breath sounds: Normal breath sounds.  Abdominal:     General: Bowel sounds are normal.     Palpations: Abdomen is soft. There is no hepatomegaly or splenomegaly.     Tenderness: There is no abdominal tenderness.  Musculoskeletal:        General: Normal range of motion.     Cervical back: Normal range of motion and neck supple.     Right lower leg: No edema.     Left lower leg: No edema.  Lymphadenopathy:     Head:     Right side of head: No submental, submandibular, tonsillar, preauricular or posterior auricular adenopathy.     Left side of head: No submental, submandibular, tonsillar, preauricular or posterior auricular adenopathy.     Cervical: No cervical adenopathy.  Skin:    General: Skin is warm and dry.     Capillary Refill: Capillary refill takes less than 2 seconds.     Findings: No rash.  Neurological:     Mental Status: She is alert and oriented to person, place, and time.     Gait: Gait is intact.     Deep Tendon Reflexes: Reflexes are normal and symmetric.     Reflex Scores:      Brachioradialis reflexes are 2+ on the right side and 2+ on the left side.      Patellar reflexes are 2+ on the right side and 2+ on the left side. Psychiatric:        Attention and Perception: Attention normal.        Mood and Affect: Mood normal.         Speech: Speech normal.        Behavior: Behavior normal. Behavior is cooperative.        Thought Content: Thought content normal.  Judgment: Judgment normal.    Results for orders placed or performed in visit on 12/14/21  [facility] HIV SCREENING LAB  Result Value Ref Range   [facility] HIV Screening Negative - Validated   [facility] HEPATITIS C SCREENING LAB  Result Value Ref Range   [facility] Hepatitis Screen Negative-Validated       Assessment & Plan:   Problem List Items Addressed This Visit       Digestive   GERD (gastroesophageal reflux disease) - Primary    Chronic, stable with minimal use of Protonix, continue this as needed only and focus on diet changes.  Mag level today.      Relevant Orders   Magnesium     Other   Elevated low density lipoprotein (LDL) cholesterol level    Noted on past labs -- will recheck today and continue focus on diet and exercise.      Relevant Orders   Comprehensive metabolic panel   Lipid Panel w/o Chol/HDL Ratio   Hair loss    Noted over past months with worsening.  Labs today.  Minoxidil ordered to trial.  Referral to dermatology.      Relevant Orders   CBC with Differential/Platelet   TSH   Testosterone   DHEA-sulfate   Ambulatory referral to Dermatology   History of abnormal cervical Pap smear    Pap performed today.      Relevant Orders   Cytology - PAP   Obesity    BMI 35.51.  Recommended eating smaller high protein, low fat meals more frequently and exercising 30 mins a day 5 times a week with a goal of 10-15lb weight loss in the next 3 months. Patient voiced their understanding and motivation to adhere to these recommendations.       Other Visit Diagnoses     Cervical cancer screening       Pap obtained in office today and sent to lab.   Relevant Orders   Cytology - PAP   Need for Td vaccine       Td vaccine today in office.   Relevant Orders   Td vaccine greater than or equal to 54yo preservative free IM   Colon cancer screening        GI referral in   Relevant Orders   Ambulatory referral to Gastroenterology   Encounter for annual physical exam       Annual physical today with labs and health maintenance reviewed, discussed with patient.        Follow up plan: Return in about 6 weeks (around 06/15/2022) for HAIR LOSS.   LABORATORY TESTING:  - Pap smear: Performed today  IMMUNIZATIONS:   - Tdap: Tetanus vaccination status reviewed: obtained today - Influenza: Refused - Pneumovax: Not applicable - Prevnar: Not applicable - COVID: Refused - HPV: Refused - Shingrix vaccine:  will think about this  SCREENING: -Mammogram: Up to date -- 10/20/21 - Colonoscopy: Ordered today - Bone Density: Not applicable  -Hearing Test: Not applicable  -Spirometry: Not applicable   PATIENT COUNSELING:   Advised to take 1 mg of folate supplement per day if capable of pregnancy.   Sexuality: Discussed sexually transmitted diseases, partner selection, use of condoms, avoidance of unintended pregnancy  and contraceptive alternatives.   Advised to avoid cigarette smoking.  I discussed with the patient that most people either abstain from alcohol or drink within safe limits (<=14/week and <=4 drinks/occasion for males, <=7/weeks and <= 3 drinks/occasion for females) and that the risk for alcohol  disorders and other health effects rises proportionally with the number of drinks per week and how often a drinker exceeds daily limits.  Discussed cessation/primary prevention of drug use and availability of treatment for abuse.   Diet: Encouraged to adjust caloric intake to maintain  or achieve ideal body weight, to reduce intake of dietary saturated fat and total fat, to limit sodium intake by avoiding high sodium foods and not adding table salt, and to maintain adequate dietary potassium and calcium preferably from fresh fruits, vegetables, and low-fat dairy products.    Stressed the importance of regular exercise  Injury  prevention: Discussed safety belts, safety helmets, smoke detector, smoking near bedding or upholstery.   Dental health: Discussed importance of regular tooth brushing, flossing, and dental visits.    NEXT PREVENTATIVE PHYSICAL DUE IN 1 YEAR. Return in about 6 weeks (around 06/15/2022) for HAIR LOSS.

## 2022-05-04 NOTE — Assessment & Plan Note (Signed)
Noted over past months with worsening.  Labs today.  Minoxidil ordered to trial.  Referral to dermatology.

## 2022-05-04 NOTE — Assessment & Plan Note (Signed)
Pap performed today.

## 2022-05-04 NOTE — Assessment & Plan Note (Signed)
BMI 35.51.  Recommended eating smaller high protein, low fat meals more frequently and exercising 30 mins a day 5 times a week with a goal of 10-15lb weight loss in the next 3 months. Patient voiced their understanding and motivation to adhere to these recommendations.

## 2022-05-05 LAB — CBC WITH DIFFERENTIAL/PLATELET
Basophils Absolute: 0.1 10*3/uL (ref 0.0–0.2)
Basos: 1 %
EOS (ABSOLUTE): 0.1 10*3/uL (ref 0.0–0.4)
Eos: 1 %
Hematocrit: 37.4 % (ref 34.0–46.6)
Hemoglobin: 11.6 g/dL (ref 11.1–15.9)
Immature Grans (Abs): 0 10*3/uL (ref 0.0–0.1)
Immature Granulocytes: 0 %
Lymphocytes Absolute: 2 10*3/uL (ref 0.7–3.1)
Lymphs: 25 %
MCH: 27.4 pg (ref 26.6–33.0)
MCHC: 31 g/dL — ABNORMAL LOW (ref 31.5–35.7)
MCV: 88 fL (ref 79–97)
Monocytes Absolute: 0.7 10*3/uL (ref 0.1–0.9)
Monocytes: 9 %
Neutrophils Absolute: 4.9 10*3/uL (ref 1.4–7.0)
Neutrophils: 64 %
Platelets: 209 10*3/uL (ref 150–450)
RBC: 4.24 x10E6/uL (ref 3.77–5.28)
RDW: 12.5 % (ref 11.7–15.4)
WBC: 7.8 10*3/uL (ref 3.4–10.8)

## 2022-05-05 LAB — COMPREHENSIVE METABOLIC PANEL
ALT: 22 IU/L (ref 0–32)
AST: 23 IU/L (ref 0–40)
Albumin/Globulin Ratio: 1.5 (ref 1.2–2.2)
Albumin: 4.6 g/dL (ref 3.8–4.9)
Alkaline Phosphatase: 113 IU/L (ref 44–121)
BUN/Creatinine Ratio: 13 (ref 9–23)
BUN: 10 mg/dL (ref 6–24)
Bilirubin Total: 0.3 mg/dL (ref 0.0–1.2)
CO2: 24 mmol/L (ref 20–29)
Calcium: 9.1 mg/dL (ref 8.7–10.2)
Chloride: 103 mmol/L (ref 96–106)
Creatinine, Ser: 0.8 mg/dL (ref 0.57–1.00)
Globulin, Total: 3.1 g/dL (ref 1.5–4.5)
Glucose: 89 mg/dL (ref 70–99)
Potassium: 4 mmol/L (ref 3.5–5.2)
Sodium: 141 mmol/L (ref 134–144)
Total Protein: 7.7 g/dL (ref 6.0–8.5)
eGFR: 88 mL/min/{1.73_m2} (ref >59)

## 2022-05-05 LAB — TESTOSTERONE: Testosterone: <3 ng/dL — ABNORMAL LOW (ref 4–50)

## 2022-05-05 LAB — LIPID PANEL W/O CHOL/HDL RATIO
Cholesterol, Total: 165 mg/dL (ref 100–199)
HDL: 55 mg/dL (ref >39)
LDL Chol Calc (NIH): 98 mg/dL (ref 0–99)
Triglycerides: 58 mg/dL (ref 0–149)
VLDL Cholesterol Cal: 12 mg/dL (ref 5–40)

## 2022-05-05 LAB — MAGNESIUM: Magnesium: 2.1 mg/dL (ref 1.6–2.3)

## 2022-05-05 LAB — TSH: TSH: 2.66 u[IU]/mL (ref 0.450–4.500)

## 2022-05-05 LAB — DHEA-SULFATE: DHEA-SO4: 82.3 ug/dL (ref 41.2–243.7)

## 2022-05-06 NOTE — Progress Notes (Signed)
Contacted via MyChart Good evening Nancy Mcdowell, your labs have returned: - Testosterone level is low, this is common post menopause -- however along with lowering in estrogen this can cause some loss of hair at times. - DHEA, another test I ran for hair loss, is normal. - Remainder of labs are all normal.  Any questions on these? Keep being amazing!!  Thank you for allowing me to participate in your care.  I appreciate you. Kindest regards, Chia Mowers

## 2022-05-10 LAB — CYTOLOGY - PAP
Comment: NEGATIVE
Comment: NEGATIVE
Comment: NEGATIVE
Diagnosis: UNDETERMINED — AB
HPV 16: NEGATIVE
HPV 18 / 45: NEGATIVE
High risk HPV: POSITIVE — AB

## 2022-05-11 ENCOUNTER — Other Ambulatory Visit: Payer: Self-pay | Admitting: Nurse Practitioner

## 2022-05-11 DIAGNOSIS — R87810 Cervical high risk human papillomavirus (HPV) DNA test positive: Secondary | ICD-10-CM | POA: Insufficient documentation

## 2022-05-11 DIAGNOSIS — R87620 Atypical squamous cells of undetermined significance on cytologic smear of vagina (ASC-US): Secondary | ICD-10-CM | POA: Insufficient documentation

## 2022-05-11 NOTE — Progress Notes (Signed)
Contacted via [place] -- also left a HIPAA compliant message with patient to call office or check MyChart for results -- if she calls please alert her to below: Good afternoon Nancy Mcdowell, your pap has returned.  I attempted to call to alert you to results and left a general message to check here or call office.  Your pap did return showing some abnormal cells again (ASC-US) and was HPV positive again.  Due to this we do need to place  a referral to gynecology for further assessment and possible biopsy to further assess.  Any questions? I will place referral and if you do not hear from them let me know:) Keep being amazing!!  Thank you for allowing me to participate in your care.  I appreciate you. Kindest regards, Yatziry Deakins

## 2022-05-16 ENCOUNTER — Telehealth: Payer: Self-pay | Admitting: Nurse Practitioner

## 2022-05-16 NOTE — Telephone Encounter (Signed)
Copied from [place] [id number]. Topic: Referral - Status >> May 16, 2022  3:30 PM Oley Balm A wrote: Reason for CRM: [facility] states that they received a referral for the patient and her insurance is not in network and she will need a referral to another office.  Please advise patient

## 2022-05-18 ENCOUNTER — Encounter: Payer: Self-pay | Admitting: Nurse Practitioner

## 2022-06-10 NOTE — Patient Instructions (Incomplete)
Alopecia Areata, Adult  Alopecia areata is a condition that causes hair loss. A person with this condition may lose hair on the scalp in patches. In some cases, a person may lose all the hair on the scalp or all the hair from the face and body. Having this condition can be emotionally difficult, but it is not dangerous. Alopecia areata is an autoimmune disease. This means that your body's defense system (immune system) mistakes normal parts of the body for germs or other things that can make you sick. When you have alopecia areata, the immune system attacks the hair follicles. What are the causes? The cause of this condition is not known. What increases the risk? You are more likely to develop this condition if you have: A family history of alopecia. A family history of another autoimmune disease, including type 1 diabetes and thyroid autoimmune disease. Eczema, asthma, and allergies. Down syndrome. What are the signs or symptoms? The main symptom of this condition is round spots of patchy hair loss on the scalp. The spots may be mildly itchy. Other symptoms include: Short dark hairs in the bald patches that are wider at the top (exclamation point hairs). Dents, white spots, or lines in the fingernails or toenails. Balding and body hair loss. This is rare. Alopecia areata usually develops in childhood, but it can develop at any age. For some people, their hair grows back on its own and hair loss does not happen again. For others, their hair may fall out and grow back in cycles. The hair loss may last many years. How is this diagnosed? This condition is diagnosed based on your symptoms and family history. Your health care provider will also check your scalp skin, teeth, and nails. Your health care provider may refer you to a specialist in hair and skin disorders (dermatologist). You may also have tests, including: A hair pull test. Blood tests or other screening tests to check for autoimmune  diseases, such as thyroid disease or diabetes. Skin biopsy to confirm the diagnosis. A procedure to examine the skin with a lighted magnifying instrument (dermoscopy). How is this treated? There is no cure for alopecia areata. The goals of treatment are to promote the regrowth of hair and prevent the immune system from overreacting. No single treatment is right for all people with alopecia areata. It depends on the type of hair loss you have and how severe it is. Work with your health care provider to find the best treatment for you. Treatment may include: Regular checkups to make sure the condition is not getting worse . This is called watchful waiting. Using steroid creams or pills for 6-8 weeks to stop the immune reaction and help hair to regrow more quickly. Using other medicines on your skin (topical medicines) to change the immune system response and support the hair growth cycle. Steroid injections. Therapy and counseling with a support group or therapist if you are having trouble coping with hair loss. Follow these instructions at home: Medicines Apply topical creams only as told by your health care provider. Take over-the-counter and prescription medicines only as told by your health care provider. General instructions Learn as much as you can about your condition. Consider getting a wig or products to make hair look fuller or to cover bald spots, if you feel uncomfortable with your appearance. Get therapy or counseling if you are having a hard time coping with hair loss. Ask your health care provider to recommend a counselor or support group. Keep   all follow-up visits as told by your health care provider. This is important. Where to find more information National Alopecia Areata Foundation: naaf.org Contact a health care provider if: Your hair loss gets worse, even with treatment. You have new symptoms. You are struggling emotionally. Get help right away if: You have a sudden  worsening of the hair loss. Summary Alopecia areata is an autoimmune condition that makes your body's defense system (immune system) attack the hair follicles. This causes you to lose hair. Having this condition can be emotionally difficult, but it is not dangerous. Treatments may include regular checkups to make sure that the condition is not getting worse, medicines, and steroid injections. This information is not intended to replace advice given to you by your health care provider. Make sure you discuss any questions you have with your health care provider. Document Revised: 07/14/2019 Document Reviewed: 07/14/2019 Elsevier Patient Education  2023 [employer]  

## 2022-06-14 ENCOUNTER — Encounter: Payer: Self-pay | Admitting: Family Medicine

## 2022-06-14 ENCOUNTER — Ambulatory Visit: Payer: Self-pay | Admitting: Family Medicine

## 2022-06-14 DIAGNOSIS — Z113 Encounter for screening for infections with a predominantly sexual mode of transmission: Secondary | ICD-10-CM

## 2022-06-14 LAB — HM HIV SCREENING LAB: HM HIV Screening: NEGATIVE

## 2022-06-14 LAB — WET PREP FOR TRICH, YEAST, CLUE
Trichomonas Exam: NEGATIVE
Yeast Exam: NEGATIVE

## 2022-06-14 MED ORDER — METRONIDAZOLE 500 MG PO TABS: 500.0000 mg | ORAL_TABLET | Freq: Two times a day (BID) | ORAL | 0 refills | Status: AC

## 2022-06-14 NOTE — Addendum Note (Signed)
Addended byShanda Howells on: 06/14/2022 10:19 AM   Modules accepted: Orders

## 2022-06-14 NOTE — Progress Notes (Signed)
[facility] Department  STI clinic/screening visit [place] [address] [phone]  Subjective:  Nancy Mcdowell is a 57 y.o. female being seen today for an STI screening visit. The patient reports they do have symptoms.  Patient reports that they do not desire a pregnancy in the next year.   They reported they are not interested in discussing contraception today.    No LMP recorded (lmp unknown). Patient is postmenopausal.  Patient has the following medical conditions:   Patient Active Problem List   Diagnosis Date Noted   Cervical high risk HPV (human papillomavirus) test positive 05/11/2022   Pap smear of vagina with ASC-US 05/11/2022   Hair loss 05/04/2022   Elevated low density lipoprotein (LDL) cholesterol level 05/01/2022   Bilateral fibrocystic breast changes 09/29/2021   Obesity 03/16/2021   Lipoma of left axilla 03/16/2021   Lipoma of right axilla 03/16/2021   Alcohol abuse (1 bottle wine qoday) 02/15/2020   Neuropathy 06/11/2017   History of abnormal cervical Pap smear 05/28/2016   Acanthosis nigricans 05/22/2016   GERD (gastroesophageal reflux disease) 05/10/2015    Chief Complaint  Patient presents with   SEXUALLY TRANSMITTED DISEASE    Itching and odor, white discharge for a couple months. Tried to self treat. Reports new partner 2 months ago.     HPI  Patient reports to clinic with few month hx of vaginal discharge and itching. Has been self treating at home with monistat- but no improvement.  Does the patient using douching products? No  Last HIV test per patient/review of record was  Lab Results  Component Value Date   HMHIVSCREEN Negative - Validated 12/06/2021    Lab Results  Component Value Date   HIV Non Reactive 05/10/2015   Patient reports last pap was  Lab Results  Component Value Date   DIAGPAP (A) 05/04/2022    - Atypical squamous cells of undetermined significance (ASC-US)    Lab Results   Component Value Date   SPECADGYN Comment 05/23/2016    Screening for MPX risk: Does the patient have an unexplained rash? No Is the patient MSM? No Does the patient endorse multiple sex partners or anonymous sex partners? No Did the patient have close or sexual contact with a person diagnosed with MPX? No Has the patient traveled outside the [place] where MPX is endemic? No Is there a high clinical suspicion for MPX-- evidenced by one of the following No  -Unlikely to be chickenpox  -Lymphadenopathy  -Rash that present in same phase of evolution on any given body part See flowsheet for further details and programmatic requirements.   Immunization history:  Immunization History  Administered Date(s) Administered   Hepatitis A 03/03/2007   Hepatitis B 03/03/2007   Td 03/24/2007, 05/04/2022   Tdap 03/24/2007, 11/04/2009     The following portions of the patient's history were reviewed and updated as appropriate: allergies, current medications, past medical history, past social history, past surgical history and problem list.  Objective:  There were no vitals filed for this visit.  Physical Exam Vitals and nursing note reviewed.  Constitutional:      Appearance: Normal appearance.  HENT:     Head: Normocephalic and atraumatic.     Mouth/Throat:     Mouth: Mucous membranes are moist.     Pharynx: Oropharynx is clear. No oropharyngeal exudate or posterior oropharyngeal erythema.  Pulmonary:     Effort: Pulmonary effort is normal.  Abdominal:  General: Abdomen is flat.     Palpations: There is no mass.     Tenderness: There is no abdominal tenderness. There is no rebound.  Genitourinary:    General: Normal vulva.     Exam position: Lithotomy position.     Pubic Area: No rash or pubic lice.      Labia:        Right: No rash or lesion.        Left: No rash or lesion.      Vagina: Normal. No vaginal discharge, erythema, bleeding or lesions.     Cervix: No cervical motion  tenderness, discharge, friability, lesion or erythema.     Uterus: Normal.      Adnexa: Right adnexa normal and left adnexa normal.     Rectum: Normal.     Comments: pH = 4 Lymphadenopathy:     Head:     Right side of head: No preauricular or posterior auricular adenopathy.     Left side of head: No preauricular or posterior auricular adenopathy.     Cervical: No cervical adenopathy.     Upper Body:     Right upper body: No supraclavicular, axillary or epitrochlear adenopathy.     Left upper body: No supraclavicular, axillary or epitrochlear adenopathy.     Lower Body: No right inguinal adenopathy. No left inguinal adenopathy.  Skin:    General: Skin is warm and dry.     Findings: No rash.  Neurological:     Mental Status: She is alert and oriented to person, place, and time.      Assessment and Plan:  Nancy Mcdowell is a 57 y.o. female presenting to the [facility] Department for STI screening  1. Screening for venereal disease  - WET PREP FOR [place], YEAST, CLUE - [facility] - Syphilis Serology, [place] [facility] - [facility] [facility]   Patient accepted all screenings including oral, vaginal CT/GC and bloodwork for HIV/RPR, and wet prep. Patient meets criteria for HepB screening? No. Ordered? not applicable Patient meets criteria for HepC screening? No. Ordered? not applicable  Treat wet prep per standing order Discussed time line for State Lab results and that patient will be called with positive results and encouraged patient to call if she had not heard in 2 weeks.  Counseled to return or seek care for continued or worsening symptoms Recommended repeat testing in 3 months with positive results. Recommended condom use with all sex  Patient is currently using  postmenopausal  to prevent pregnancy.    No follow-ups on file.  Future Appointments  Date Time Provider [place]  06/15/2022  1:40 PM Venita Lick, NP [facility] [place], [place]

## 2022-06-14 NOTE — Progress Notes (Signed)
Pt appointment for STI screening with symptoms. Seen by FNP Lowella Petties. Initial lab results reviewed with patient. Treated for BV per order.

## 2022-06-15 ENCOUNTER — Telehealth: Payer: 59 | Admitting: Nurse Practitioner

## 2022-06-18 LAB — GONOCOCCUS CULTURE

## 2022-07-31 DIAGNOSIS — L668 Other cicatricial alopecia: Secondary | ICD-10-CM | POA: Diagnosis not present

## 2022-07-31 DIAGNOSIS — L2089 Other atopic dermatitis: Secondary | ICD-10-CM | POA: Diagnosis not present

## 2022-10-31 DIAGNOSIS — L668 Other cicatricial alopecia: Secondary | ICD-10-CM | POA: Diagnosis not present

## 2022-11-12 ENCOUNTER — Encounter: Payer: Self-pay | Admitting: Family Medicine

## 2022-11-12 ENCOUNTER — Ambulatory Visit: Payer: Self-pay

## 2022-11-12 ENCOUNTER — Ambulatory Visit: Payer: Self-pay | Admitting: Family Medicine

## 2022-11-12 DIAGNOSIS — Z113 Encounter for screening for infections with a predominantly sexual mode of transmission: Secondary | ICD-10-CM

## 2022-11-12 LAB — WET PREP FOR TRICH, YEAST, CLUE
Trichomonas Exam: NEGATIVE
Yeast Exam: NEGATIVE

## 2022-11-12 LAB — HM HIV SCREENING LAB: HM HIV Screening: NEGATIVE

## 2022-11-12 NOTE — Progress Notes (Signed)
[facility] Department  STI clinic/screening visit [address] [place] [address] [phone]  Subjective:  Nancy Mcdowell is a 57 y.o. female being seen today for an STI screening visit. The patient reports they do have symptoms.  Patient reports that they do not desire a pregnancy in the next year.   They reported they are not interested in discussing contraception today.    No LMP recorded (lmp unknown). Patient is postmenopausal.  Patient has the following medical conditions:   Patient Active Problem List   Diagnosis Date Noted   Cervical high risk HPV (human papillomavirus) test positive 05/11/2022   Pap smear of vagina with ASC-US 05/11/2022   Hair loss 05/04/2022   Elevated low density lipoprotein (LDL) cholesterol level 05/01/2022   Bilateral fibrocystic breast changes 09/29/2021   Obesity 03/16/2021   Lipoma of left axilla 03/16/2021   Lipoma of right axilla 03/16/2021   Alcohol abuse (1 bottle wine qoday) 02/15/2020   Neuropathy 06/11/2017   History of abnormal cervical Pap smear 05/28/2016   Acanthosis nigricans 05/22/2016   GERD (gastroesophageal reflux disease) 05/10/2015    Chief Complaint  Patient presents with   SEXUALLY TRANSMITTED DISEASE    Having symptoms of itching and stinging in the vagina    HPI  Patient reports to clinic with a 1 month hx of vaginal itching, discharge and odor. Reports that she had a painful lesion on her labia but it has since healed. Last sex was in March. Accepts blood work for syphilis today  Does the patient using douching products? No  Last HIV test per patient/review of record was  Lab Results  Component Value Date   HMHIVSCREEN Negative - Patient reported 06/14/2022    Lab Results  Component Value Date   HIV Non Reactive 05/10/2015   Patient reports last pap was  Lab Results  Component Value Date   DIAGPAP (A) 05/04/2022    - Atypical squamous cells of undetermined significance  (ASC-US)    Lab Results  Component Value Date   SPECADGYN Comment 05/23/2016    Screening for MPX risk: Does the patient have an unexplained rash? No Is the patient MSM? No Does the patient endorse multiple sex partners or anonymous sex partners? No Did the patient have close or sexual contact with a person diagnosed with MPX? No Has the patient traveled outside the [place] where MPX is endemic? No Is there a high clinical suspicion for MPX-- evidenced by one of the following No  -Unlikely to be chickenpox  -Lymphadenopathy  -Rash that present in same phase of evolution on any given body part See flowsheet for further details and programmatic requirements.   Immunization history:  Immunization History  Administered Date(s) Administered   Hepatitis A 03/03/2007   Hepatitis B 03/03/2007   Td 03/24/2007, 05/04/2022   Tdap 03/24/2007, 11/04/2009     The following portions of the patient's history were reviewed and updated as appropriate: allergies, current medications, past medical history, past social history, past surgical history and problem list.  Objective:  There were no vitals filed for this visit.  Physical Exam Vitals and nursing note reviewed.  Constitutional:      Appearance: Normal appearance.  HENT:     Head: Normocephalic and atraumatic.     Mouth/Throat:     Mouth: Mucous membranes are moist.     Pharynx: Oropharynx is clear. No oropharyngeal exudate or posterior oropharyngeal erythema.  Pulmonary:     Effort: Pulmonary effort is  normal.  Abdominal:     General: Abdomen is flat.     Palpations: There is no mass.     Tenderness: There is no abdominal tenderness. There is no rebound.  Genitourinary:    General: Normal vulva.     Exam position: Lithotomy position.     Pubic Area: No rash or pubic lice.      Tanner stage (genital): 5.     Labia:        Right: No rash or lesion.        Left: No rash or lesion.      Vagina: Vaginal discharge present. No  erythema, bleeding or lesions.     Cervix: No cervical motion tenderness, discharge, friability, lesion or erythema.     Uterus: Normal.      Adnexa: Right adnexa normal and left adnexa normal.     Rectum: Normal.     Comments: pH = 5  Mild amt of thin white discharge present  Lymphadenopathy:     Head:     Right side of head: No preauricular or posterior auricular adenopathy.     Left side of head: No preauricular or posterior auricular adenopathy.     Cervical: No cervical adenopathy.     Upper Body:     Right upper body: No supraclavicular, axillary or epitrochlear adenopathy.     Left upper body: No supraclavicular, axillary or epitrochlear adenopathy.     Lower Body: No right inguinal adenopathy. No left inguinal adenopathy.  Skin:    General: Skin is warm and dry.     Findings: No rash.  Neurological:     Mental Status: She is alert and oriented to person, place, and time.    Assessment and Plan:  LAJUNE BROCKBANK is a 57 y.o. female presenting to the [facility] Department for STI screening  1. Screening for venereal disease  - Chlamydia/Gonorrhea [place] [facility] - HIV [facility] - Syphilis Serology, [facility] - WET PREP FOR [place], YEAST, CLUE   Patient accepted all screenings including  vaginal CT/GC and bloodwork for HIV/RPR, and wet prep. Patient meets criteria for HepB screening? No. Ordered? not applicable Patient meets criteria for HepC screening? No. Ordered? not applicable  Treat wet prep per standing order Discussed time line for State Lab results and that patient will be called with positive results and encouraged patient to call if she had not heard in 2 weeks.  Counseled to return or seek care for continued or worsening symptoms Recommended repeat testing in 3 months with positive results. Recommended condom use with all sex  Patient is currently using  postmenopausal  to prevent pregnancy.    Return if symptoms worsen or fail to improve,  for STI screening.  No future appointments. Total time spent 20 minutes  Lenice Llamas, [place]

## 2022-11-12 NOTE — Progress Notes (Signed)
Pt is here for STD screening.  Wet mount results reviewed, no treatment required per SO.  Pt given condoms.  Berdie Ogren, RN

## 2023-01-18 ENCOUNTER — Encounter: Payer: Self-pay | Admitting: Intensive Care

## 2023-01-18 ENCOUNTER — Emergency Department
Admission: EM | Admit: 2023-01-18 | Discharge: 2023-01-18 | Disposition: A | Discharge status: Home / Self Care | Payer: 59 | Attending: Emergency Medicine | Admitting: Emergency Medicine

## 2023-01-18 ENCOUNTER — Other Ambulatory Visit: Payer: Self-pay

## 2023-01-18 DIAGNOSIS — Y92410 Unspecified street and highway as the place of occurrence of the external cause: Secondary | ICD-10-CM | POA: Diagnosis not present

## 2023-01-18 DIAGNOSIS — M7918 Myalgia, other site: Secondary | ICD-10-CM

## 2023-01-18 DIAGNOSIS — M791 Myalgia, unspecified site: Secondary | ICD-10-CM | POA: Diagnosis not present

## 2023-01-18 DIAGNOSIS — M542 Cervicalgia: Secondary | ICD-10-CM | POA: Diagnosis not present

## 2023-01-18 DIAGNOSIS — M546 Pain in thoracic spine: Secondary | ICD-10-CM | POA: Insufficient documentation

## 2023-01-18 DIAGNOSIS — G44319 Acute post-traumatic headache, not intractable: Secondary | ICD-10-CM

## 2023-01-18 DIAGNOSIS — R519 Headache, unspecified: Secondary | ICD-10-CM | POA: Insufficient documentation

## 2023-01-18 MED ORDER — ONDANSETRON 4 MG PO TBDP: 4.0000 mg | ORAL_TABLET | Freq: Three times a day (TID) | ORAL | 0 refills | Status: DC | PRN

## 2023-01-18 MED ORDER — NAPROXEN 500 MG PO TABS: 500.0000 mg | ORAL_TABLET | Freq: Two times a day (BID) | ORAL | 0 refills | Status: AC

## 2023-01-18 MED ORDER — KETOROLAC TROMETHAMINE 30 MG/ML IJ SOLN
30.0000 mg | Freq: Once | INTRAMUSCULAR | Status: AC
  Administered 2023-01-18: 30 mg via INTRAMUSCULAR
  Filled 2023-01-18: qty 1

## 2023-01-18 MED ORDER — CYCLOBENZAPRINE HCL 10 MG PO TABS
10.0000 mg | ORAL_TABLET | Freq: Once | ORAL | Status: AC
  Administered 2023-01-18: 10 mg via ORAL
  Filled 2023-01-18: qty 1

## 2023-01-18 MED ORDER — ONDANSETRON 4 MG PO TBDP
4.0000 mg | ORAL_TABLET | Freq: Once | ORAL | Status: AC
  Administered 2023-01-18: 4 mg via ORAL
  Filled 2023-01-18: qty 1

## 2023-01-18 MED ORDER — CYCLOBENZAPRINE HCL 5 MG PO TABS: 5.0000 mg | ORAL_TABLET | Freq: Three times a day (TID) | ORAL | 0 refills | Status: DC | PRN

## 2023-01-18 MED ORDER — ACETAMINOPHEN 325 MG PO TABS
650.0000 mg | ORAL_TABLET | Freq: Once | ORAL | Status: AC
  Administered 2023-01-18: 650 mg via ORAL
  Filled 2023-01-18: qty 2
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
## 2023-01-18 NOTE — ED Notes (Signed)
Family brought the patient some food.

## 2023-01-21 ENCOUNTER — Encounter: Payer: Self-pay | Admitting: Nurse Practitioner

## 2023-01-21 DIAGNOSIS — R928 Other abnormal and inconclusive findings on diagnostic imaging of breast: Secondary | ICD-10-CM

## 2023-01-21 NOTE — Group Note (Deleted)

## 2023-01-24 ENCOUNTER — Ambulatory Visit: Payer: 59 | Admitting: Nurse Practitioner

## 2023-01-24 ENCOUNTER — Encounter: Payer: Self-pay | Admitting: Nurse Practitioner

## 2023-01-24 ENCOUNTER — Ambulatory Visit
Admission: RE | Admit: 2023-01-24 | Discharge: 2023-01-24 | Disposition: A | Discharge status: Home / Self Care | Payer: 59 | Attending: Nurse Practitioner | Admitting: Nurse Practitioner

## 2023-01-24 ENCOUNTER — Ambulatory Visit
Admission: RE | Admit: 2023-01-24 | Discharge: 2023-01-24 | Disposition: A | Discharge status: Home / Self Care | Payer: 59 | Source: Ambulatory Visit | Attending: Nurse Practitioner | Admitting: Nurse Practitioner

## 2023-01-24 VITALS — BP 128/88 | HR 86 | Temp 98.3°F | Resp 18 | Ht 63.0 in | Wt 189.2 lb

## 2023-01-24 DIAGNOSIS — Z041 Encounter for examination and observation following transport accident: Secondary | ICD-10-CM | POA: Diagnosis not present

## 2023-01-24 DIAGNOSIS — M47816 Spondylosis without myelopathy or radiculopathy, lumbar region: Secondary | ICD-10-CM | POA: Diagnosis not present

## 2023-01-24 DIAGNOSIS — Z1231 Encounter for screening mammogram for malignant neoplasm of breast: Secondary | ICD-10-CM | POA: Diagnosis not present

## 2023-01-24 DIAGNOSIS — M545 Low back pain, unspecified: Secondary | ICD-10-CM | POA: Diagnosis not present

## 2023-01-24 DIAGNOSIS — M438X6 Other specified deforming dorsopathies, lumbar region: Secondary | ICD-10-CM | POA: Diagnosis not present

## 2023-01-24 MED ORDER — PREDNISONE 10 MG PO TABS: ORAL_TABLET | ORAL | 0 refills | Status: DC

## 2023-01-24 MED ORDER — HYDROCODONE-ACETAMINOPHEN 5-325 MG PO TABS: 1.0000 | ORAL_TABLET | Freq: Four times a day (QID) | ORAL | 0 refills | Status: AC | PRN

## 2023-01-24 MED ORDER — METHOCARBAMOL 750 MG PO TABS: 750.0000 mg | ORAL_TABLET | Freq: Three times a day (TID) | ORAL | 0 refills | Status: DC | PRN

## 2023-01-24 NOTE — Progress Notes (Signed)
Contacted via MyChart   I have good news Nancy Mcdowell, there is no fracture in your lower back.  You do have some mild arthritis, but no fracture:)
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
## 2023-01-24 NOTE — Patient Instructions (Signed)
IMAGING LOCATION: [id number], [place], [place] [address] [phone]   Acute Back Pain, Adult Acute back pain is sudden and usually short-lived. It is often caused by an injury to the muscles and tissues in the back. The injury may result from: A muscle, tendon, or ligament getting overstretched or torn. Ligaments are tissues that connect bones to each other. Lifting something improperly can cause a back strain. Wear and tear (degeneration) of the spinal disks. Spinal disks are circular tissue that provide cushioning between the bones of the spine (vertebrae). Twisting motions, such as while playing sports or doing yard work. A hit to the back. Arthritis. You may have a physical exam, lab tests, and imaging tests to find the cause of your pain. Acute back pain usually goes away with rest and home care. Follow these instructions at home: Managing pain, stiffness, and swelling Take over-the-counter and prescription medicines only as told by your health care provider. Treatment may include medicines for pain and inflammation that are taken by mouth or applied to the skin, or muscle relaxants. Your health care provider may recommend applying ice during the first 24-48 hours after your pain starts. To do this: Put ice in a plastic bag. Place a towel between your skin and the bag. Leave the ice on for 20 minutes, 2-3 times a day. Remove the ice if your skin turns bright red. This is very important. If you cannot feel pain, heat, or cold, you have a greater risk of damage to the area. If directed, apply heat to the affected area as often as told by your health care provider. Use the heat source that your health care provider recommends, such as a moist heat pack or a heating pad. Place a towel between your skin and the heat source. Leave the heat on for 20-30 minutes. Remove the heat if your skin turns bright red. This is especially important if you are unable to feel pain, heat, or  cold. You have a greater risk of getting burned. Activity  Do not stay in bed. Staying in bed for more than 1-2 days can delay your recovery. Sit up and stand up straight. Avoid leaning forward when you sit or hunching over when you stand. If you work at a desk, sit close to it so you do not need to lean over. Keep your chin tucked in. Keep your neck drawn back, and keep your elbows bent at a 90-degree angle (right angle). Sit high and close to the steering wheel when you drive. Add lower back (lumbar) support to your car seat, if needed. Take short walks on even surfaces as soon as you are able. Try to increase the length of time you walk each day. Do not sit, drive, or stand in one place for more than 30 minutes at a time. Sitting or standing for long periods of time can put stress on your back. Do not drive or use heavy machinery while taking prescription pain medicine. Use proper lifting techniques. When you bend and lift, use positions that put less stress on your back: [place] your knees. Keep the load close to your body. Avoid twisting. Exercise regularly as told by your health care provider. Exercising helps your back heal faster and helps prevent back injuries by keeping muscles strong and flexible. Work with a physical therapist to make a safe exercise program, as recommended by your health care provider. Do any exercises as told by your physical therapist. Lifestyle Maintain a healthy weight.  Extra weight puts stress on your back and makes it difficult to have good posture. Avoid activities or situations that make you feel anxious or stressed. Stress and anxiety increase muscle tension and can make back pain worse. Learn ways to manage anxiety and stress, such as through exercise. General instructions Sleep on a firm mattress in a comfortable position. Try lying on your side with your knees slightly bent. If you lie on your back, put a pillow under your knees. Keep your head and neck in  a straight line with your spine (neutral position) when using electronic equipment like smartphones or pads. To do this: Raise your smartphone or pad to look at it instead of bending your head or neck to look down. Put the smartphone or pad at the level of your face while looking at the screen. Follow your treatment plan as told by your health care provider. This may include: Cognitive or behavioral therapy. Acupuncture or massage therapy. Meditation or yoga. Contact a health care provider if: You have pain that is not relieved with rest or medicine. You have increasing pain going down into your legs or buttocks. Your pain does not improve after 2 weeks. You have pain at night. You lose weight without trying. You have a fever or chills. You develop nausea or vomiting. You develop abdominal pain. Get help right away if: You develop new bowel or bladder control problems. You have unusual weakness or numbness in your arms or legs. You feel faint. These symptoms may represent a serious problem that is an emergency. Do not wait to see if the symptoms will go away. Get medical help right away. Call your local emergency services (911 in the [place] Do not drive yourself to the hospital. Summary Acute back pain is sudden and usually short-lived. Use proper lifting techniques. When you bend and lift, use positions that put less stress on your back. Take over-the-counter and prescription medicines only as told by your health care provider, and apply heat or ice as told. This information is not intended to replace advice given to you by your health care provider. Make sure you discuss any questions you have with your health care provider. Document Revised: 07/22/2020 Document Reviewed: 07/22/2020 Elsevier Patient Education  2024 [employer].

## 2023-02-03 NOTE — Patient Instructions (Signed)
Spondylolysis  Spondylolysis is a small break or crack (stress fracture) in a bone in the spine (vertebra) in the lower back (lumbar spine). The stress fracture occurs on the bony mass between and behind the vertebra. Spondylolysis may be caused by an injury (trauma) or by overuse. Since the lower back is almost always under pressure from daily living, this stress fracture usually does not heal normally. Spondylolysis may eventually cause one vertebra to slip forward and out of place (spondylolisthesis). What are the causes? This condition may be caused by: Trauma, such as a fall. Excessive wear and tear. This often results from doing sports or physical activities that involve repetitive overstretching and rotation of the spine. What increases the risk? You are more likely to develop this condition if you have: A family history of this condition. An inward curvature of your spine (lordosis). A condition that affects your spine, such as spina bifida. You are more likely to develop this condition if you participate in: Gymnastics or dance. Football. Wrestling or martial arts. Weight lifting. Tennis. Swimming. What are the signs or symptoms? Symptoms of this condition may include: Long-lasting (chronic) pain in the lower back. Stiffness in the back or legs. Tightness in the backs of the thighs (hamstring muscles). In some cases, there may be no symptoms. How is this diagnosed? This condition may be diagnosed based on: Your symptoms. Your medical history. A physical exam. Imaging tests, such as: X-rays. A CT scan. An MRI. How is this treated? This condition may be treated with: Rest. You may be asked to avoid or change activities that put strain on your back until your symptoms improve. Pain medicines, or medicines such as NSAIDs to help with swelling and discomfort. Injections of steroid medicine in your back to relieve pain, swelling, and numbness. A brace to stabilize and  support your back. Physical therapy. You may work with an occupational therapist or physical therapist who can teach you how to reduce pressure on your back while you do activities. Surgery. This may be needed if you have: A severe injury. Pain that lasts for more than 6 months. Numbness in your pelvic region. Changes in your ability to control your bladder or bowels. Follow these instructions at home: Medicines Take over-the-counter and prescription medicines only as told by your health care provider. Ask your health care provider if the medicine prescribed to you: Requires you to avoid driving or using heavy machinery. Can cause constipation. You may need to take these actions to prevent or treat constipation: Drink enough fluid to keep your urine pale yellow. Take over-the-counter or prescription medicines. Eat foods that are high in fiber, such as beans, whole grains, and fresh fruits and vegetables. Limit foods that are high in fat and processed sugars, such as fried or sweet foods. If you have a removable brace: Wear the brace as told by your health care provider. Remove it only as told by your health care provider. Keep the brace clean. If the brace is not waterproof: Do not let it get wet. Cover it with a watertight covering when you take a bath or a shower. Activity Rest and return to your normal activities as told by your health care provider. Ask your health care provider what activities are safe for you. Ask your health care provider when it is safe to drive if you have a back brace. Work with a physical therapist to make a safe exercise program as told by your health care provider. Do exercises as told  by your physical therapist. This may include exercises to strengthen your back and abdominal muscles (core exercises). Managing pain, stiffness, and swelling     If directed, put ice on the affected area. If you have a removable brace, remove it as told by your health care  provider. Put ice in a plastic bag. Place a towel between your skin and the bag. Leave the ice on for 20 minutes, 2-3 times a day. If your skin turns bright red, remove the ice right away to prevent skin damage. The risk of skin damage is higher if you cannot feel pain, heat, or cold. If directed, apply heat to the affected area as often as told by your health care provider. Use the heat source that your health care provider recommends, such as a moist heat pack or a heating pad. If you have a removable brace, remove it as told by your health care provider. Place a towel between your skin and the heat source. Leave the heat on for 20-30 minutes. If your skin turns bright red, remove the heat right away to prevent burns. The risk of burns is higher if you cannot feel pain, heat, or cold. General instructions Do not use any products that contain nicotine or tobacco. These products include cigarettes, chewing tobacco, and vaping devices, such as e-cigarettes. These can delay bone healing. If you need help quitting, ask your health care provider. Maintain a healthy weight. Extra weight puts stress on your back. Keep all follow-up visits. Your health care provider will monitor if your fracture is healing and help you manage your pain. Contact a health care provider if: You have pain that gets worse or does not get better. Get help right away if: You have severe back pain. Your ability to control your bowel or bladder changes. You develop weakness or numbness in your legs. You cannot stand or walk. Summary Spondylolysis is a small break or crack (stress fracture) in a bone in the spine (vertebra) in the lower back (lumbar spine). This condition may be treated by resting, medicines, physical therapy, wearing a brace, or surgery. Rest and return to your normal activities as told by your health care provider. Contact a health care provider if you have pain that gets worse or does not get  better. This information is not intended to replace advice given to you by your health care provider. Make sure you discuss any questions you have with your health care provider. Document Revised: 07/13/2021 Document Reviewed: 07/13/2021 Elsevier Patient Education  2024 [employer].

## 2023-02-07 ENCOUNTER — Encounter: Payer: Self-pay | Admitting: Nurse Practitioner

## 2023-02-07 ENCOUNTER — Telehealth: Payer: Self-pay

## 2023-02-07 ENCOUNTER — Ambulatory Visit (INDEPENDENT_AMBULATORY_CARE_PROVIDER_SITE_OTHER): Payer: 59 | Admitting: Nurse Practitioner

## 2023-02-07 VITALS — BP 128/79 | HR 76 | Temp 98.4°F | Wt 191.6 lb

## 2023-02-07 DIAGNOSIS — M545 Low back pain, unspecified: Secondary | ICD-10-CM | POA: Diagnosis not present

## 2023-02-07 MED ORDER — LIDOCAINE 5 % EX PTCH: 1.0000 | MEDICATED_PATCH | CUTANEOUS | 0 refills | Status: AC

## 2023-02-07 NOTE — Assessment & Plan Note (Signed)
Improving.  Imaging showed mild arthritic changes, educated her on this and things to do to help support back.  Will send in Lidocaine patches to wear at work.  Recommend she continue to alternate Tylenol and Ibuprofen + use heat.  Continue to take Robaxin as needed at night.  Return if worsening pain presents.

## 2023-02-07 NOTE — Telephone Encounter (Signed)
PA for Lidocaine patches initiated and submitted via Cover My Meds. Key: Cindra Eves
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
## 2023-02-12 ENCOUNTER — Telehealth: Payer: Self-pay

## 2023-02-12 NOTE — Telephone Encounter (Signed)
Transition Care Management Unsuccessful Follow-up Telephone Call  Date of discharge and from where:  01/18/2023 [facility]  Attempts:  1st Attempt  Reason for unsuccessful TCM follow-up call:  Unable to leave message  Sharron Petruska [facility]  [facility], [facility] Guide Direct Dial: [phone]  Website: [email or web address]

## 2023-02-13 ENCOUNTER — Telehealth: Payer: Self-pay

## 2023-02-13 NOTE — Telephone Encounter (Signed)
Transition Care Management Unsuccessful Follow-up Telephone Call  Date of discharge and from where:  01/18/2023 [facility]  Attempts:  2nd Attempt  Reason for unsuccessful TCM follow-up call:  Left voice message  Burnadette Baskett [facility]  [facility], [facility] Resource Care Guide Direct Dial: [phone]  Website: [email or web address]

## 2023-04-12 DIAGNOSIS — J3489 Other specified disorders of nose and nasal sinuses: Secondary | ICD-10-CM | POA: Diagnosis not present

## 2023-04-12 DIAGNOSIS — Z789 Other specified health status: Secondary | ICD-10-CM | POA: Diagnosis not present

## 2023-04-12 DIAGNOSIS — Z6833 Body mass index (BMI) 33.0-33.9, adult: Secondary | ICD-10-CM | POA: Diagnosis not present

## 2023-05-11 NOTE — Patient Instructions (Addendum)
 Please call to schedule your mammogram and/or bone density: [facility] at [facility]  Address: [address], [place], [place] [address] Phone: [phone]  [facility] [address] [place],  [place]  [address] Phone: [phone]    Healthy Eating, Adult Healthy eating may help you get and keep a healthy body weight, reduce the risk of chronic disease, and live a long and productive life. It is important to follow a healthy eating pattern. Your nutritional and calorie needs should be met mainly by different nutrient-rich foods. What are tips for following this plan? Reading food labels Read labels and choose the following: Reduced or low sodium products. Juices with 100% fruit juice. Foods with low saturated fats (<3 g per serving) and high polyunsaturated and monounsaturated fats. Foods with whole grains, such as whole wheat, cracked wheat, brown rice, and wild rice. Whole grains that are fortified with folic acid. This is recommended for females who are pregnant or who want to become pregnant. Read labels and do not eat or drink the following: Foods or drinks with added sugars. These include foods that contain brown sugar, corn sweetener, corn syrup, dextrose, fructose, glucose, high-fructose corn syrup, honey, invert sugar, lactose, malt syrup, maltose, molasses, raw sugar, sucrose, trehalose, or turbinado sugar. Limit your intake of added sugars to less than 10% of your total daily calories. Do not eat more than the following amounts of added sugar per day: 6 teaspoons (25 g) for females. 9 teaspoons (38 g) for males. Foods that contain processed or refined starches and grains. Refined grain products, such as white flour, degermed cornmeal, white bread, and white rice. Shopping Choose nutrient-rich snacks, such as vegetables, whole fruits, and nuts. Avoid high-calorie and high-sugar snacks, such as potato chips,  fruit snacks, and candy. Use oil-based dressings and spreads on foods instead of solid fats such as butter, margarine, sour cream, or cream cheese. Limit pre-made sauces, mixes, and instant products such as flavored rice, instant noodles, and ready-made pasta. Try more plant-protein sources, such as tofu, tempeh, black beans, edamame, lentils, nuts, and seeds. Explore eating plans such as the Mediterranean diet or vegetarian diet. Try heart-healthy dips made with beans and healthy fats like hummus and guacamole. Vegetables go great with these. Cooking Use oil to saut or stir-fry foods instead of solid fats such as butter, margarine, or lard. Try baking, boiling, grilling, or broiling instead of frying. Remove the fatty part of meats before cooking. Steam vegetables in water or broth. Meal planning  At meals, imagine dividing your plate into fourths: One-half of your plate is fruits and vegetables. One-fourth of your plate is whole grains. One-fourth of your plate is protein, especially lean meats, poultry, eggs, tofu, beans, or nuts. Include low-fat dairy as part of your daily diet. Lifestyle Choose healthy options in all settings, including home, work, school, restaurants, or stores. Prepare your food safely: Wash your hands after handling raw meats. Where you prepare food, keep surfaces clean by regularly washing with hot, soapy water. Keep raw meats separate from ready-to-eat foods, such as fruits and vegetables. Cook seafood, meat, poultry, and eggs to the recommended temperature. Get a food thermometer. Store foods at safe temperatures. In general: Keep cold foods at 43F (4.4C) or below. Keep hot foods at 143F (60C) or above. Keep your freezer at [facility] (-17.8C) or below. Foods are not safe to eat if they have been between the temperatures of 40-143F (4.4-60C) for more  than 2 hours. What foods should I eat? Fruits Aim to eat 1-2 cups of fresh, canned (in natural juice),  or frozen fruits each day. One cup of fruit equals 1 small apple, 1 large banana, 8 large strawberries, 1 cup (237 g) canned fruit,  cup (82 g) dried fruit, or 1 cup (240 mL) 100% juice. Vegetables Aim to eat 2-4 cups of fresh and frozen vegetables each day, including different varieties and colors. One cup of vegetables equals 1 cup (91 g) broccoli or cauliflower florets, 2 medium carrots, 2 cups (150 g) raw, leafy greens, 1 large tomato, 1 large bell pepper, 1 large sweet potato, or 1 medium white potato. Grains Aim to eat 5-10 ounce-equivalents of whole grains each day. Examples of 1 ounce-equivalent of grains include 1 slice of bread, 1 cup (40 g) ready-to-eat cereal, 3 cups (24 g) popcorn, or  cup (93 g) cooked rice. Meats and other proteins Try to eat 5-7 ounce-equivalents of protein each day. Examples of 1 ounce-equivalent of protein include 1 egg,  oz nuts (12 almonds, 24 pistachios, or 7 walnut halves), 1/4 cup (90 g) cooked beans, 6 tablespoons (90 g) hummus or 1 tablespoon (16 g) peanut butter. A cut of meat or fish that is the size of a deck of cards is about 3-4 ounce-equivalents (85 g). Of the protein you eat each week, try to have at least 8 sounce (227 g) of seafood. This is about 2 servings per week. This includes salmon, trout, herring, sardines, and anchovies. Dairy Aim to eat 3 cup-equivalents of fat-free or low-fat dairy each day. Examples of 1 cup-equivalent of dairy include 1 cup (240 mL) milk, 8 ounces (250 g) yogurt, 1 ounces (44 g) natural cheese, or 1 cup (240 mL) fortified soy milk. Fats and oils Aim for about 5 teaspoons (21 g) of fats and oils per day. Choose monounsaturated fats, such as canola and olive oils, mayonnaise made with olive oil or avocado oil, avocados, peanut butter, and most nuts, or polyunsaturated fats, such as sunflower, corn, and soybean oils, walnuts, pine nuts, sesame seeds, sunflower seeds, and flaxseed. Beverages Aim for 6 eight-ounce glasses of  water per day. Limit coffee to 3-5 eight-ounce cups per day. Limit caffeinated beverages that have added calories, such as soda and energy drinks. If you drink alcohol: Limit how much you have to: 0-1 drink a day if you are female. 0-2 drinks a day if you are female. Know how much alcohol is in your drink. In the [place] one drink is one 12 oz bottle of beer (355 mL), one 5 oz glass of wine (148 mL), or one 1 oz glass of hard liquor (44 mL). Seasoning and other foods Try not to add too much salt to your food. Try using herbs and spices instead of salt. Try not to add sugar to food. This information is based on [place] nutrition guidelines. To learn more, visit [employer]. Exact amounts may vary. You may need different amounts. This information is not intended to replace advice given to you by your health care provider. Make sure you discuss any questions you have with your health care provider. Document Revised: 01/29/2022 Document Reviewed: 01/29/2022 Elsevier Patient Education  2024 [employer].

## 2023-05-16 ENCOUNTER — Ambulatory Visit (INDEPENDENT_AMBULATORY_CARE_PROVIDER_SITE_OTHER): Payer: 59 | Admitting: Nurse Practitioner

## 2023-05-16 ENCOUNTER — Other Ambulatory Visit (HOSPITAL_COMMUNITY)
Admission: RE | Admit: 2023-05-16 | Discharge: 2023-05-16 | Disposition: A | Discharge status: Home / Self Care | Payer: 59 | Source: Ambulatory Visit | Attending: Nurse Practitioner | Admitting: Nurse Practitioner

## 2023-05-16 ENCOUNTER — Encounter: Payer: Self-pay | Admitting: Nurse Practitioner

## 2023-05-16 VITALS — BP 107/69 | HR 71 | Temp 98.0°F | Ht 63.5 in | Wt 189.8 lb

## 2023-05-16 DIAGNOSIS — R87810 Cervical high risk human papillomavirus (HPV) DNA test positive: Secondary | ICD-10-CM

## 2023-05-16 DIAGNOSIS — E6609 Other obesity due to excess calories: Secondary | ICD-10-CM | POA: Diagnosis not present

## 2023-05-16 DIAGNOSIS — K219 Gastro-esophageal reflux disease without esophagitis: Secondary | ICD-10-CM

## 2023-05-16 DIAGNOSIS — E7800 Pure hypercholesterolemia, unspecified: Secondary | ICD-10-CM | POA: Diagnosis not present

## 2023-05-16 DIAGNOSIS — Z8742 Personal history of other diseases of the female genital tract: Secondary | ICD-10-CM

## 2023-05-16 DIAGNOSIS — E66812 Obesity, class 2: Secondary | ICD-10-CM

## 2023-05-16 DIAGNOSIS — L83 Acanthosis nigricans: Secondary | ICD-10-CM | POA: Diagnosis not present

## 2023-05-16 DIAGNOSIS — Z6835 Body mass index (BMI) 35.0-35.9, adult: Secondary | ICD-10-CM | POA: Diagnosis not present

## 2023-05-16 DIAGNOSIS — Z0000 Encounter for general adult medical examination without abnormal findings: Secondary | ICD-10-CM | POA: Diagnosis not present

## 2023-05-16 DIAGNOSIS — F1010 Alcohol abuse, uncomplicated: Secondary | ICD-10-CM

## 2023-05-16 NOTE — Progress Notes (Signed)
 BP 107/69   Pulse 71   Temp 98 F (36.7 C) (Oral)   Ht 5' 3.5 (1.613 m)   Wt 189 lb 12.8 oz (86.1 kg)   LMP  (LMP Unknown) Comment: 3 years ago  SpO2 98%   BMI 33.09 kg/m    Subjective:    Patient ID: Nancy Mcdowell, female    DOB: [birth year], 58 y.o.   MRN: [id number]  HPI: Nancy Mcdowell is a 58 y.o. female presenting on 05/16/2023 for comprehensive medical examination. Current medical complaints include:none  She currently lives with: family Menopausal Symptoms: no  The 10-year ASCVD risk score (Arnett DK, et al., 2019) is: 1.5%   Values used to calculate the score:     Age: 46 years     Sex: Female     Is Non-Hispanic African American: Yes     Diabetic: No     Tobacco smoker: No     Systolic Blood Pressure: 107 mmHg     Is BP treated: No     HDL Cholesterol: 55 mg/dL     Total Cholesterol: 165 mg/dL  GERD Taking Protonix with benefit.  Takes as needed only. GERD control status: stable Satisfied with current treatment? yes Heartburn frequency: occasional Medication side effects: no  Medication compliance: stable Previous GERD medications: baking soda and water Dysphagia: no Odynophagia:  no Hematemesis: no Blood in stool: no EGD: no   Functional Status Survey: Is the patient deaf or have difficulty hearing?: No Does the patient have difficulty seeing, even when wearing glasses/contacts?: No Does the patient have difficulty concentrating, remembering, or making decisions?: No Does the patient have difficulty walking or climbing stairs?: No Does the patient have difficulty dressing or bathing?: No Does the patient have difficulty doing errands alone such as visiting a doctor's office or shopping?: No   Depression Screen done today and results listed below:     05/16/2023    8:22 AM 02/07/2023    8:53 AM 01/24/2023    9:13 AM 05/04/2022    8:29 AM 09/29/2021    9:48 AM  Depression screen PHQ 2/9  Decreased Interest 0 0 0 1 0  Down, Depressed, Hopeless  0 0 0 1 0  PHQ - 2 Score 0 0 0 2 0  Altered sleeping 0 0 1 1 0  Tired, decreased energy 0 0 0 0 0  Change in appetite 0 0 0 0 0  Feeling bad or failure about yourself  0 0 0 0 0  Trouble concentrating 0 0 0 0 0  Moving slowly or fidgety/restless 0 0 1 0 0  Suicidal thoughts 0 0 0 0 0  PHQ-9 Score 0 0 2 3 0  Difficult doing work/chores Not difficult at all Not difficult at all  Not difficult at all       05/16/2023    8:25 AM 02/07/2023    8:53 AM 01/24/2023    9:12 AM 05/04/2022    8:29 AM  GAD 7 : Generalized Anxiety Score  Nervous, Anxious, on Edge 0 0 0 0  Control/stop worrying 0 0 0 1  Worry too much - different things 0 0 0 1  Trouble relaxing 0 0 0 0  Restless 0 0 0 0  Easily annoyed or irritable 0 0 0 0  Afraid - awful might happen 0 0 0 0  Total GAD 7 Score 0 0 0 2  Anxiety Difficulty Not difficult at all Not difficult at all Not difficult  at all Not difficult at all      05/03/2021    2:20 PM 09/29/2021    9:48 AM 05/04/2022    8:29 AM 02/07/2023    8:51 AM 05/16/2023    8:22 AM  Fall Risk  Falls in the past year? 0 0 0 0 0  Was there an injury with Fall? 0 0 0 0 0  Fall Risk Category Calculator 0 0 0 0 0  Fall Risk Category (Retired) Low Low Low    (RETIRED) Patient Fall Risk Level  Low fall risk     Patient at Risk for Falls Due to History of fall(s) No Fall Risks No Fall Risks No Fall Risks No Fall Risks  Fall risk Follow up Falls evaluation completed Falls evaluation completed Falls evaluation completed Falls evaluation completed Falls evaluation completed    Past Medical History:  Past Medical History:  Diagnosis Date   Alcohol abuse (1 bottle wine qoday) 02/15/2020   Anemia 12/25/2016   Cataract    COVID-19 03/2021   dx week of thanksgiving   GERD (gastroesophageal reflux disease)    Heart murmur    Said she was told she had one at 20   Sleep apnea    mild-was not given cpap    Surgical History:  Past Surgical History:  Procedure Laterality Date    COLONOSCOPY WITH PROPOFOL  N/A 02/04/2017   Procedure: COLONOSCOPY WITH PROPOFOL ;  Surgeon: Therisa Bi, MD;  Location: [facility] ENDOSCOPY;  Service: Gastroenterology;  Laterality: N/A;   EYE SURGERY Left 2010   retinal detachment   LIPOMA EXCISION Bilateral 04/25/2021   Procedure: EXCISION LIPOMA, bilateral axillary lipomas;  Surgeon: Desiderio Schanz, MD;  Location: [facility] ORS;  Service: General;  Laterality: Bilateral;  Provider requesting 2.5 hours / 150 minutes for procedure.   RETINAL DETACHMENT SURGERY Left 2010, 2011   2    Medications:  Current Outpatient Medications on File Prior to Visit  Medication Sig   acetaminophen  (TYLENOL ) 500 MG tablet Take 2 tablets (1,000 mg total) by mouth every 6 (six) hours as needed for mild pain.   ibuprofen  (ADVIL ) 600 MG tablet Take 1 tablet (600 mg total) by mouth every 8 (eight) hours as needed for moderate pain.   ketoconazole (NIZORAL) 2 % shampoo Apply 1 Application topically 3 (three) times a week.   lidocaine  (LIDODERM ) 5 % Place 1 patch onto the skin daily. Remove & Discard patch within 12 hours or as directed by MD   minoxidil  (LONITEN ) 2.5 MG tablet Take 1.25 mg by mouth 2 (two) times daily.   MINOXIDIL , TOPICAL, 5 % SOLN Apply 1 application  topically at bedtime. Massage minoxidil  onto the scalp with fingers.  Hands should be washed after application. Apply at least two hours before bed to allow adequate time for drying.   pantoprazole (PROTONIX) 40 MG tablet Take 40 mg by mouth daily as needed (Acid reflux).   spironolactone (ALDACTONE) 100 MG tablet Take 200 mg by mouth daily.   No current facility-administered medications on file prior to visit.    Allergies:  No Known Allergies  Social History:  Social History   Socioeconomic History   Marital status: Legally Separated    Spouse name: Not on file   Number of children: Not on file   Years of education: Not on file   Highest education level: Not on file  Occupational History   Not on  file  Tobacco Use   Smoking status: Never   Smokeless tobacco: Never  Vaping Use   Vaping status: Never Used  Substance and Sexual Activity   Alcohol use: Yes    Alcohol/week: 1.0 standard drink of alcohol    Types: 1 Glasses of wine per week    Comment: glass of wine once or twice a week   Drug use: Not Currently    Types: Marijuana    Comment: last used in teens   Sexual activity: Yes    Partners: Male    Birth control/protection: Post-menopausal, Condom  Other Topics Concern   Not on file  Social History Narrative   Not on file   Social Drivers of Health   Financial Resource Strain: Low Risk  (03/16/2021)   Overall Financial Resource Strain (CARDIA)    Difficulty of Paying Living Expenses: Not hard at all  Food Insecurity: No Food Insecurity (03/16/2021)   Hunger Vital Sign    Worried About Running Out of Food in the Last Year: Never true    Ran Out of Food in the Last Year: Never true  Transportation Needs: No Transportation Needs (03/16/2021)   PRAPARE - Administrator, Civil Service (Medical): No    Lack of Transportation (Non-Medical): No  Physical Activity: Insufficiently Active (03/16/2021)   Exercise Vital Sign    Days of Exercise per Week: 2 days    Minutes of Exercise per Session: 30 min  Stress: No Stress Concern Present (03/16/2021)   [employer] of Occupational Health - Occupational Stress Questionnaire    Feeling of Stress : Only a little  Social Connections: Moderately Isolated (03/16/2021)   Social Connection and Isolation Panel [NHANES]    Frequency of Communication with Friends and Family: More than three times a week    Frequency of Social Gatherings with Friends and Family: More than three times a week    Attends Religious Services: More than 4 times per year    Active Member of [employer] or Organizations: No    Attends Banker Meetings: Never    Marital Status: Never married  Intimate Partner Violence: Not At Risk  (12/06/2021)   Humiliation, Afraid, Rape, and Kick questionnaire    Fear of Current or Ex-Partner: No    Emotionally Abused: No    Physically Abused: No    Sexually Abused: No   Social History   Tobacco Use  Smoking Status Never  Smokeless Tobacco Never   Social History   Substance and Sexual Activity  Alcohol Use Yes   Alcohol/week: 1.0 standard drink of alcohol   Types: 1 Glasses of wine per week   Comment: glass of wine once or twice a week    Family History:  Family History  Problem Relation Age of Onset   Hyperlipidemia Mother    Hypertension Mother    Stroke Mother    Arthritis Mother    Kidney disease Mother    Thyroid disease Mother    Kidney disease Father    Heart attack Father    Diabetes Father    Cancer Maternal Aunt        Breast Cancer   Breast cancer Maternal Aunt 50   Breast cancer Maternal Aunt 60   Parkinson's disease Maternal Grandmother    Heart attack Paternal Grandfather    Breast cancer Cousin    Past medical history, surgical history, medications, allergies, family history and social history reviewed with patient today and changes made to appropriate areas of the chart.   ROS All other ROS negative except what is listed  above and in the HPI.      Objective:    BP 107/69   Pulse 71   Temp 98 F (36.7 C) (Oral)   Ht 5' 3.5 (1.613 m)   Wt 189 lb 12.8 oz (86.1 kg)   LMP  (LMP Unknown) Comment: 3 years ago  SpO2 98%   BMI 33.09 kg/m   Wt Readings from Last 3 Encounters:  05/16/23 189 lb 12.8 oz (86.1 kg)  02/07/23 191 lb 9.6 oz (86.9 kg)  01/24/23 189 lb 3.2 oz (85.8 kg)    Physical Exam Vitals and nursing note reviewed. Exam conducted with a chaperone present.  Constitutional:      General: She is awake. She is not in acute distress.    Appearance: She is well-developed and well-groomed. She is obese. She is not ill-appearing or toxic-appearing.  HENT:     Head: Normocephalic and atraumatic.     Right Ear: Hearing, tympanic  membrane, ear canal and external ear normal. No drainage.     Left Ear: Hearing, tympanic membrane, ear canal and external ear normal. No drainage.     Nose: Nose normal.     Right Sinus: No maxillary sinus tenderness or frontal sinus tenderness.     Left Sinus: No maxillary sinus tenderness or frontal sinus tenderness.     Mouth/Throat:     Mouth: Mucous membranes are moist.     Pharynx: Oropharynx is clear. Uvula midline. No pharyngeal swelling, oropharyngeal exudate or posterior oropharyngeal erythema.  Eyes:     General: Lids are normal.        Right eye: No discharge.        Left eye: No discharge.     Extraocular Movements: Extraocular movements intact.     Conjunctiva/sclera: Conjunctivae normal.     Pupils: Pupils are equal, round, and reactive to light.     Visual Fields: Right eye visual fields normal and left eye visual fields normal.  Neck:     Thyroid: No thyromegaly.     Vascular: No carotid bruit.     Trachea: Trachea normal.  Cardiovascular:     Rate and Rhythm: Normal rate and regular rhythm.     Heart sounds: Normal heart sounds. No murmur heard.    No gallop.  Pulmonary:     Effort: Pulmonary effort is normal. No accessory muscle usage or respiratory distress.     Breath sounds: Normal breath sounds.  Chest:  Breasts:    Right: Normal.     Left: Normal.  Abdominal:     General: Bowel sounds are normal.     Palpations: Abdomen is soft. There is no hepatomegaly or splenomegaly.     Tenderness: There is no abdominal tenderness.     Hernia: There is no hernia in the left inguinal area or right inguinal area.  Genitourinary:    Exam position: Lithotomy position.     Labia:        Right: No rash.        Left: No rash.      Urethra: No prolapse.     Vagina: Erythema (mild atrophy) present.     Cervix: Friability present.     Uterus: Normal.      Adnexa: Right adnexa normal and left adnexa normal.     Comments: Cervix anterior and midline, pap obtained.   Slightly friable with scant bleeding during pap.  Discussed with patient. Musculoskeletal:        General: Normal range of motion.  Cervical back: Normal range of motion and neck supple.     Right lower leg: No edema.     Left lower leg: No edema.  Lymphadenopathy:     Head:     Right side of head: No submental, submandibular, tonsillar, preauricular or posterior auricular adenopathy.     Left side of head: No submental, submandibular, tonsillar, preauricular or posterior auricular adenopathy.     Cervical: No cervical adenopathy.     Upper Body:     Right upper body: No supraclavicular, axillary or pectoral adenopathy.     Left upper body: No supraclavicular, axillary or pectoral adenopathy.  Skin:    General: Skin is warm and dry.     Capillary Refill: Capillary refill takes less than 2 seconds.     Findings: No rash.  Neurological:     Mental Status: She is alert and oriented to person, place, and time.     Gait: Gait is intact.     Deep Tendon Reflexes: Reflexes are normal and symmetric.     Reflex Scores:      Brachioradialis reflexes are 2+ on the right side and 2+ on the left side.      Patellar reflexes are 2+ on the right side and 2+ on the left side. Psychiatric:        Attention and Perception: Attention normal.        Mood and Affect: Mood normal.        Speech: Speech normal.        Behavior: Behavior normal. Behavior is cooperative.        Thought Content: Thought content normal.        Judgment: Judgment normal.    Results for orders placed or performed in visit on 11/22/22  [facility] SCREENING LAB   Collection Time: 11/12/22 12:00 AM  Result Value Ref Range   [facility] HIV Screening Negative - Validated       Assessment & Plan:   Problem List Items Addressed This Visit       Digestive   GERD (gastroesophageal reflux disease)   Chronic, stable with minimal use of Protonix, continue this as needed only and focus on diet changes.  Mag level today.       Relevant Orders   CBC with Differential/Platelet   TSH   Magnesium     Musculoskeletal and Integument   Acanthosis nigricans   Check A1c today and CMP.      Relevant Orders   CBC with Differential/Platelet   TSH   HgB A1c     Other   Cervical high risk HPV (human papillomavirus) test positive   Repeat pap today, if abnormal will get into GYN.      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  Is focused on diet and exercise.  Continue this.  Labs today. The 10-year ASCVD risk score (Arnett DK, et al., 2019) is: 1.5%   Values used to calculate the score:     Age: 20 years     Sex: Female     Is Non-Hispanic African American: Yes     Diabetic: No     Tobacco smoker: No     Systolic Blood Pressure: 107 mmHg     Is BP treated: No     HDL Cholesterol: 55 mg/dL     Total Cholesterol: 165 mg/dL  Relevant Orders   Comprehensive metabolic panel   Lipid Panel w/o Chol/HDL Ratio   History of abnormal cervical Pap smear   Pap performed today. If abnormal get into GYN.      Obesity   Other Visit Diagnoses       Encounter for annual physical exam       Annual physical today, discussed all preventative with patient.        Follow up plan: Return in about 1 year (around 05/15/2024) for Annual Physical.   LABORATORY TESTING:  - Pap smear: pap done  IMMUNIZATIONS:   - Tdap: Tetanus vaccination status reviewed: last tetanus booster within 10 years. - Influenza: Refused - Pneumovax: Not applicable - Prevnar: Not applicable - COVID: Refused - HPV: Not applicable - Shingrix vaccine: Refused  SCREENING: -Mammogram: Ordered today last was 10/20/21 - Colonoscopy: will reach out to insurance about this  - Bone Density: Not applicable  -Hearing Test: Not applicable  -Spirometry: Not applicable   PATIENT COUNSELING:   Advised to take 1 mg of folate supplement per day if capable of  pregnancy.   Sexuality: Discussed sexually transmitted diseases, partner selection, use of condoms, avoidance of unintended pregnancy  and contraceptive alternatives.   Advised to avoid cigarette smoking.  I discussed with the patient that most people either abstain from alcohol or drink within safe limits (<=14/week and <=4 drinks/occasion for males, <=7/weeks and <= 3 drinks/occasion for females) and that the risk for alcohol disorders and other health effects rises proportionally with the number of drinks per week and how often a drinker exceeds daily limits.  Discussed cessation/primary prevention of drug use and availability of treatment for abuse.   Diet: Encouraged to adjust caloric intake to maintain  or achieve ideal body weight, to reduce intake of dietary saturated fat and total fat, to limit sodium intake by avoiding high sodium foods and not adding table salt, and to maintain adequate dietary potassium and calcium preferably from fresh fruits, vegetables, and low-fat dairy products.    Stressed the importance of regular exercise  Injury prevention: Discussed safety belts, safety helmets, smoke detector, smoking near bedding or upholstery.   Dental health: Discussed importance of regular tooth brushing, flossing, and dental visits.    NEXT PREVENTATIVE PHYSICAL DUE IN 1 YEAR. Return in about 1 year (around 05/15/2024) for Annual Physical.

## 2023-05-16 NOTE — Assessment & Plan Note (Signed)
Check A1c today and CMP.

## 2023-05-16 NOTE — Assessment & Plan Note (Signed)
 Repeat pap today, if abnormal will get into GYN.

## 2023-05-16 NOTE — Assessment & Plan Note (Signed)
Chronic, stable with minimal use of Protonix, continue this as needed only and focus on diet changes.  Mag level today.

## 2023-05-16 NOTE — Assessment & Plan Note (Signed)
 Noted on past labs.  Is focused on diet and exercise.  Continue this.  Labs today. The 10-year ASCVD risk score (Arnett DK, et al., 2019) is: 1.5%   Values used to calculate the score:     Age: 58 years     Sex: Female     Is Non-Hispanic African American: Yes     Diabetic: No     Tobacco smoker: No     Systolic Blood Pressure: 107 mmHg     Is BP treated: No     HDL Cholesterol: 55 mg/dL     Total Cholesterol: 165 mg/dL

## 2023-05-16 NOTE — Assessment & Plan Note (Signed)
 Pap performed today. If abnormal get into GYN.

## 2023-05-17 LAB — CBC WITH DIFFERENTIAL/PLATELET
Basophils Absolute: 0.1 10*3/uL (ref 0.0–0.2)
Basos: 1 %
EOS (ABSOLUTE): 0.1 10*3/uL (ref 0.0–0.4)
Eos: 2 %
Hematocrit: 38.7 % (ref 34.0–46.6)
Hemoglobin: 12 g/dL (ref 11.1–15.9)
Immature Grans (Abs): 0 10*3/uL (ref 0.0–0.1)
Immature Granulocytes: 0 %
Lymphocytes Absolute: 2 10*3/uL (ref 0.7–3.1)
Lymphs: 29 %
MCH: 28.2 pg (ref 26.6–33.0)
MCHC: 31 g/dL — ABNORMAL LOW (ref 31.5–35.7)
MCV: 91 fL (ref 79–97)
Monocytes Absolute: 0.7 10*3/uL (ref 0.1–0.9)
Monocytes: 10 %
Neutrophils Absolute: 3.9 10*3/uL (ref 1.4–7.0)
Neutrophils: 58 %
Platelets: 207 10*3/uL (ref 150–450)
RBC: 4.25 x10E6/uL (ref 3.77–5.28)
RDW: 11.8 % (ref 11.7–15.4)
WBC: 6.9 10*3/uL (ref 3.4–10.8)

## 2023-05-17 LAB — COMPREHENSIVE METABOLIC PANEL
ALT: 13 [IU]/L (ref 0–32)
AST: 18 [IU]/L (ref 0–40)
Albumin: 4.5 g/dL (ref 3.8–4.9)
Alkaline Phosphatase: 92 [IU]/L (ref 44–121)
BUN/Creatinine Ratio: 15 (ref 9–23)
BUN: 15 mg/dL (ref 6–24)
Bilirubin Total: 0.3 mg/dL (ref 0.0–1.2)
CO2: 25 mmol/L (ref 20–29)
Calcium: 9.4 mg/dL (ref 8.7–10.2)
Chloride: 105 mmol/L (ref 96–106)
Creatinine, Ser: 0.98 mg/dL (ref 0.57–1.00)
Globulin, Total: 3 g/dL (ref 1.5–4.5)
Glucose: 96 mg/dL (ref 70–99)
Potassium: 4.7 mmol/L (ref 3.5–5.2)
Sodium: 140 mmol/L (ref 134–144)
Total Protein: 7.5 g/dL (ref 6.0–8.5)
eGFR: 68 mL/min/{1.73_m2} (ref >59)

## 2023-05-17 LAB — LIPID PANEL W/O CHOL/HDL RATIO
Cholesterol, Total: 194 mg/dL (ref 100–199)
HDL: 48 mg/dL (ref >39)
LDL Chol Calc (NIH): 134 mg/dL — ABNORMAL HIGH (ref 0–99)
Triglycerides: 65 mg/dL (ref 0–149)
VLDL Cholesterol Cal: 12 mg/dL (ref 5–40)

## 2023-05-17 LAB — MAGNESIUM: Magnesium: 2 mg/dL (ref 1.6–2.3)

## 2023-05-17 LAB — HEMOGLOBIN A1C
Est. average glucose Bld gHb Est-mCnc: 103 mg/dL
Hgb A1c MFr Bld: 5.2 % (ref 4.8–5.6)

## 2023-05-17 LAB — TSH: TSH: 2.18 u[IU]/mL (ref 0.450–4.500)

## 2023-05-17 NOTE — Progress Notes (Signed)
 Contacted via MyChart The 10-year ASCVD risk score (Arnett DK, et al., 2019) is: 2%   Values used to calculate the score:     Age: 58 years     Sex: Female     Is Non-Hispanic African American: Yes     Diabetic: No     Tobacco smoker: No     Systolic Blood Pressure: 107 mmHg     Is BP treated: No     HDL Cholesterol: 48 mg/dL     Total Cholesterol: 194 mg/dL   Good morning Nancy Mcdowell, your labs have returned and overall are stable with exception of LDL on lipid panel. Your LDL is above normal. The LDL is the bad cholesterol. Over time and in combination with inflammation and other factors, this contributes to plaque which in turn may lead to stroke and/or heart attack down the road. Sometimes high LDL is primarily genetic, and people might be eating all the right foods but still have high numbers. Other times, there is room for improvement in one's diet and eating healthier can bring this number down and potentially reduce one's risk of heart attack and/or stroke. To reduce your LDL, Remember - more fruits and vegetables, more fish, and limit red meat and dairy products. More soy, nuts, beans, barley, lentils, oats and plant sterol ester enriched margarine instead of butter. I also encourage eliminating sugar and processed food. Remember, shop on the outside of the grocery store and visit your [employer]. We will recheck annually.  No medication needed at this time.  Any questions? Keep being stellar!!  Thank you for allowing me to participate in your care.  I appreciate you. Kindest regards, Janiene Aarons

## 2023-05-21 LAB — CYTOLOGY - PAP
Comment: NEGATIVE
Comment: NEGATIVE
Comment: NEGATIVE
Diagnosis: NEGATIVE
Diagnosis: REACTIVE
HPV 16: NEGATIVE
HPV 18 / 45: NEGATIVE
High risk HPV: POSITIVE — AB

## 2023-05-22 ENCOUNTER — Other Ambulatory Visit: Payer: Self-pay | Admitting: Nurse Practitioner

## 2023-05-22 DIAGNOSIS — R87810 Cervical high risk human papillomavirus (HPV) DNA test positive: Secondary | ICD-10-CM

## 2023-05-22 NOTE — Progress Notes (Signed)
 Contacted via MyChart, however does not always check and need to know what GYN she wants to see so please call with results too and alert me.:) Good morning Nancy Mcdowell, your pap has returned.  HPV testing remains positive, but on this check cytology is negative.  However, since on your last pap HPV was also positive and at that time cytology showed abnormal cells (ASC-US ) the guidelines do recommend a referral to gynecology to discuss possible colposcopy, this is where they take a small biopsy of the cervical area to further assess.  Please let me know if you have a preference on which gynecologist you would like me to put the referral to.  Let me know ASAP.  Any questions? Keep being stellar!!  Thank you for allowing me to participate in your care.  I appreciate you. Kindest regards, Tiwatope Emmitt

## 2023-05-30 NOTE — Patient Instructions (Signed)
Colposcopy  Colposcopy is a procedure to examine the lowest part of the uterus (cervix) for abnormalities or signs of disease. This procedure is done using an instrument that makes objects appear larger and provides light (colposcope). During the procedure, your health care provider may remove a tissue sample to look at under a microscope (biopsy). A biopsy may be done if any unusual cells are seen during the colposcopy. You may have a colposcopy if you have: An abnormal Pap smear, also called a Pap test. This screening test is used to check for signs of cancer or infection of the vagina, cervix, and uterus. An HPV (human papillomavirus) test and get a positive result for a type of HPV that puts you at high risk of cancer. Certain conditions or symptoms, such as: A sore, or lesion, on your cervix. Genital warts on your vulva, vagina, or cervix. Pain during sex. Vaginal bleeding, especially after sex. A growth on your cervix (cervical polyp) that needs to be removed. Let your health care provider know about: Any allergies you have, including allergies to medicines, latex, or iodine. All medicines you are taking, including vitamins, herbs, eye drops, creams, and over-the-counter medicines. Any bleeding problems you have. Any surgeries you have had. Any medical conditions you have, such as pelvic inflammatory disease (PID) or an endometrial disorder. The pattern of your menstrual cycles and the form of birth control (contraception) you use, if any. Your medical history, including any cervical treatments and how well you tolerated the procedure (if you have ever fainted). Whether you are pregnant or may be pregnant. What are the risks? Generally, this is a safe procedure. However, problems may occur, including: Infection. Symptoms of infection may include fever, bad-smelling vaginal discharge, or pelvic pain. Vaginal bleeding. Allergic reactions to medicines. Damage to nearby structures or  organs. What happens before the procedure? Medicines Ask your health care provider about: Changing or stopping your regular medicines. This is especially important if you are taking diabetes medicines or blood thinners. Taking medicines such as aspirin and ibuprofen. These medicines can thin your blood. Do not take these medicines unless your health care provider tells you to take them. Your health care provider will likely tell you to avoid taking aspirin, or medicine that contains aspirin, for 7 days before the procedure. Taking over-the-counter medicines, vitamins, herbs, and supplements. General instructions Tell your health care provider if you have your menstrual period now or will have it at the time of your procedure. A colposcopy is not normally done during your menstrual period. If you use contraception, continue to use it before your procedure. For 24 hours before the procedure: Do not use douche products or tampons. Do not use medicines, creams, or suppositories in the vagina. Do not have sex or insert anything into your vagina. Ask your health care provider what steps will be taken to prevent infection. What happens during the procedure? You will lie down on your back, with your feet in foot rests (stirrups). An instrument called a speculum will be inserted into your vagina. This will be used so your health care provider can see your cervix and the inside of your vagina. A cotton swab will be used to place a small amount of a liquid (solution) on the areas to be examined. This solution makes it easier to see abnormal cells. You may feel a slight burning during this part. The colposcope will be used to scan the cervix with a bright white light. The colposcope will be held near   your vulva and will make your vulva, vagina, and cervix look bigger so they can be seen better. If a biopsy is needed: You may be given a medicine to numb the area (local anesthetic). Surgical tools will be  used to remove mucus and cells through your vagina. You may feel mild pain while the tissue sample is removed. Bleeding may occur. A solution may be used to stop the bleeding. The tissue removed will be sent to a lab to be looked at under a microscope. The procedure may vary among health care providers and hospitals. What happens after the procedure? You may have some cramping in your abdomen. This should go away after a few minutes. It is up to you to get the results of your procedure. Ask your health care provider, or the department that is doing the procedure, when your results will be ready. Summary Colposcopy is a procedure to examine the lowest part of the uterus (cervix), for signs of disease. A biopsy may be done as part of the procedure. You may have some cramping in your abdomen. This should go away after a few minutes. It is up to you to get the results of your procedure. Ask your health care provider, or the department that is doing the procedure, when your results will be ready. This information is not intended to replace advice given to you by your health care provider. Make sure you discuss any questions you have with your health care provider. Document Revised: 09/25/2020 Document Reviewed: 09/25/2020 Elsevier Patient Education  2024 [employer]  

## 2023-05-30 NOTE — Progress Notes (Signed)
    GYNECOLOGY OFFICE COLPOSCOPY PROCEDURE NOTE  58 y.o. [id number] here for colposcopy for NML cytology with POSITIVE high risk HPV (neg 16/18) pap smear on 05/16/2023. Previous pap smear on 05/04/2022 with ASCUS HR HPV (neg 16/18). Discussed role for HPV in cervical dysplasia, need for surveillance.  Patient gave informed written consent, time out was performed.  Placed in lithotomy position. Cervix viewed with speculum and colposcope after application of acetic acid.   Colposcopy adequate? No  acetowhite lesion(s) noted at 8 o'clock; corresponding biopsies obtained.  ECC specimen obtained. All specimens were labeled and sent to pathology.  Chaperone was present during entire procedure.  Patient was given post procedure instructions.  Will follow up pathology and manage accordingly; patient will be contacted with results and recommendations.  Routine preventative health maintenance measures emphasized.   Hildred Laser, MD [facility] at [facility]

## 2023-05-31 ENCOUNTER — Other Ambulatory Visit (HOSPITAL_COMMUNITY)
Admission: RE | Admit: 2023-05-31 | Discharge: 2023-05-31 | Disposition: A | Discharge status: Home / Self Care | Payer: 59 | Source: Ambulatory Visit | Attending: Obstetrics and Gynecology | Admitting: Obstetrics and Gynecology

## 2023-05-31 ENCOUNTER — Ambulatory Visit (INDEPENDENT_AMBULATORY_CARE_PROVIDER_SITE_OTHER): Payer: 59 | Admitting: Obstetrics and Gynecology

## 2023-05-31 VITALS — BP 127/79 | HR 75 | Resp 16 | Ht 63.5 in | Wt 188.4 lb

## 2023-05-31 DIAGNOSIS — R87810 Cervical high risk human papillomavirus (HPV) DNA test positive: Secondary | ICD-10-CM | POA: Insufficient documentation

## 2023-05-31 DIAGNOSIS — N870 Mild cervical dysplasia: Secondary | ICD-10-CM

## 2023-06-04 ENCOUNTER — Encounter: Payer: Self-pay | Admitting: Obstetrics and Gynecology

## 2023-06-04 LAB — SURGICAL PATHOLOGY

## 2023-06-10 DIAGNOSIS — L6612 Frontal fibrosing alopecia: Secondary | ICD-10-CM | POA: Diagnosis not present

## 2023-11-06 ENCOUNTER — Ambulatory Visit: Payer: Self-pay | Admitting: Family Medicine

## 2023-11-06 DIAGNOSIS — Z113 Encounter for screening for infections with a predominantly sexual mode of transmission: Secondary | ICD-10-CM

## 2023-11-06 DIAGNOSIS — N950 Postmenopausal bleeding: Secondary | ICD-10-CM

## 2023-11-06 LAB — HM HIV SCREENING LAB: HM HIV Screening: NEGATIVE

## 2023-11-06 LAB — WET PREP FOR TRICH, YEAST, CLUE
Clue Cell Exam: NEGATIVE
Trichomonas Exam: NEGATIVE
Yeast Exam: NEGATIVE

## 2023-11-06 NOTE — Progress Notes (Signed)
 Pt is here for STD screening. Wet prep results reviewed with pt, no treatment required per standing order. Condoms Given.Sonda Primes, RN.

## 2023-11-06 NOTE — Progress Notes (Signed)
 [facility] Department STI clinic [address], [address] [place] [address] Main phone: [phone]  STI screening visit  Subjective:  Nancy Mcdowell is a 58 y.o. female being seen today for an STI screening visit. The patient reports they do have symptoms.  Patient reports that they do not desire a pregnancy in the next year (post menopausal).  No LMP recorded (lmp unknown). Patient is postmenopausal.  Patient has the following medical conditions:  Patient Active Problem List   Diagnosis Date Noted   Cervical high risk HPV (human papillomavirus) test positive 05/11/2022   Elevated low density lipoprotein (LDL) cholesterol level 05/01/2022   Bilateral fibrocystic breast changes 09/29/2021   Obesity 03/16/2021   Lipoma of left axilla 03/16/2021   Lipoma of right axilla 03/16/2021   Alcohol abuse (1 bottle wine qoday) 02/15/2020   Neuropathy 06/11/2017   History of abnormal cervical Pap smear 05/28/2016   Acanthosis nigricans 05/22/2016   GERD (gastroesophageal reflux disease) 05/10/2015   Chief Complaint  Patient presents with   SEXUALLY TRANSMITTED DISEASE    HPI Patient reports small, irritated bumps between her vaginal opening and anus. No history of herpes. Also reports itching, vulvar irritation. She is post-menopausal. Does report a few weeks of spotting last year. Has not had a menstrual period in >5 years.  See flowsheet for further details and programmatic requirements Hyperlink available at the top of the signed note in blue.  Flow sheet content below:  Pregnancy Intention Screening Does the patient want to become pregnant in the next year?: No Does the patient's partner want to become pregnant in the next year?: No Would the patient like to discuss contraceptive options today?: No All Patients Anyone smoke around pt and/or pt's children?: No Anyone smoke inside pt's house?: No Anyone smoke inside car?: No Anyone smoke inside the  workplace?: No Reason For STD Screen STD Screening: Has symptoms Have you ever had an STD?: Yes History of Antibiotic use in the past 2 weeks?: No STD Symptoms Genital Itching: Yes Lower abdominal pain: No Discharge: No Dysuria: Yes Genital ulcer / lesion: Yes Genital lesion s/s: irritated bumps Rash: No Vaginal irritation: Yes Oral / Other skin ulcer: No Pain with sex: No Sore Throat: No Visual Changes: No Vaginal Bleeding: No Risk Factors for Hep B Household, sexual, or needle sharing contact of a person infected with Hep B: No Sexual contact with a person who uses drugs not as prescribed?: No Currently or Ever used drugs not as prescribed: No HIV Positive: No PRep Patient: No Men who have sex with men: No Have Hepatitis C: No History of Incarceration: No History of Homeslessness?: No Anal sex following anal drug use?: No Risk Factors for Hep C Currently using drugs not as prescribed: No Sexual partner(s) currently using drugs as not prescribed: No History of drug use: No HIV Positive: No People with a history of incarceration: No People born between the years of 20 and 57: No Abuse History Has patient ever been abused physically?: No Has patient ever been abused sexually?: No Does patient feel they have a problem with Anxiety?: No Does patient feel they have a problem with Depression?: No Counseling Patient counseled to use condoms with all sex: Condoms given RTC in 2-3 weeks for test results: Yes Clinic will call if test results abnormal before test result appt.: Yes Test results given to patient Patient counseled to use condoms with all sex: Condoms given   Screening for MPX risk: Does the  patient have an unexplained rash? No Is the patient MSM? No Does the patient endorse multiple sex partners or anonymous sex partners? No Did the patient have close or sexual contact with a person diagnosed with MPX? No Has the patient traveled outside the [place]  where MPX  is endemic? No Is there a high clinical suspicion for MPX-- evidenced by one of the following No  -Unlikely to be chickenpox  -Lymphadenopathy  -Rash that present in same phase of evolution on any given body part  Screenings: Last HIV test per patient/review of record was  Lab Results  Component Value Date   HMHIVSCREEN Negative - Validated 11/12/2022    Lab Results  Component Value Date   HIV Non Reactive 05/10/2015     Last HEPC test per patient/review of record was  Lab Results  Component Value Date   HMHEPCSCREEN Negative-Validated 12/06/2021   No components found for: HEPC   Last HEPB test per patient/review of record was No components found for: HMHEPBSCREEN   Patient reports last pap was:   Lab Results  Component Value Date   SPECADGYN Comment 05/23/2016   Result Date Procedure Results Follow-ups  05/31/2023 Surgical pathology SURGICAL PATHOLOGY: SURGICAL PATHOLOGY CASE: [id number] PATIENT: Nancy Mcdowell Surgical Pathology Report     Clinical History: NILM with positive high risk HPV (cm)     FINAL MICROSCOPIC DIAGNOSIS:  A. CERVIX, 8 O'CLOCK, BIOPSY:      Low-grade squamous i...   05/16/2023 Cytology - PAP High risk HPV: Positive (A) HPV 16: Negative HPV 18 / 45: Negative Adequacy: Satisfactory for evaluation; transformation zone component PRESENT. Diagnosis: - Negative for Intraepithelial Lesions or Malignancy (NILM) Diagnosis: - Benign reactive/reparative changes Microorganisms: Shift in flora suggestive of bacterial vaginosis Comment: Normal Reference Range HPV - Negative Comment: Normal Reference Range HPV 16- Negative Comment: Normal Reference Range HPV 16 18 45 -Negative   05/04/2022 Cytology - PAP High risk HPV: Positive (A) HPV 16: Negative HPV 18 / 45: Negative Adequacy: Satisfactory for evaluation; transformation zone component PRESENT. Diagnosis: - Atypical squamous cells of undetermined significance (ASC-US ) (A) Comment: Normal  Reference Range HPV - Negative Comment: Normal Reference Range HPV 16- Negative Comment: Normal Reference Range HPV 16 18 45 -Negative   06/12/2017 HM PAP SMEAR HM Pap smear: Negative, HPV negative   06/12/2017 IGP, Aptima HPV    06/12/2017 IGP, Aptima HPV    06/12/2017 IGP, Aptima HPV    05/23/2016 Pap IG, CT/NG NAA, and HPV (high risk) DIAGNOSIS:: Comment Specimen adequacy:: Comment Clinician Provided ICD10: Comment Performed by:: Comment PAP Smear Comment: . Note:: Comment Test Methodology: Comment HPV, high-risk: Positive (A) Chlamydia, Nuc. Acid Amp: Negative Gonococcus by Nucleic Acid Amp: Negative   05/10/2015 Pap IG, CT/NG NAA, and HPV (high risk) DIAGNOSIS:: Comment Specimen adequacy:: Comment Clinician Provided ICD10: Comment Performed by:: Comment PAP Smear Comment: . Note:: Comment Test Methodology: Comment HPV, high-risk: Negative   03/12/2014 HM PAP SMEAR HM Pap smear: normal     Immunization history:  Immunization History  Administered Date(s) Administered   Hepatitis A 03/03/2007   Hepatitis B 03/03/2007   Td 03/24/2007, 05/04/2022   Tdap 03/24/2007, 11/04/2009    The following portions of the patient's history were reviewed and updated as appropriate: allergies, current medications, past medical history, past social history, past surgical history and problem list.  Objective:  There were no vitals filed for this visit.  Physical Exam Vitals and nursing note reviewed. Exam conducted with a chaperone present Susette Satchel [facility]).  Constitutional:      Appearance: Normal appearance.  HENT:     Head: Normocephalic and atraumatic.     Mouth/Throat:     Mouth: Mucous membranes are moist.     Pharynx: Oropharynx is clear. No oropharyngeal exudate or posterior oropharyngeal erythema.  Pulmonary:     Effort: Pulmonary effort is normal.  Abdominal:     General: Abdomen is flat.     Palpations: There is no mass.     Tenderness: There is no abdominal tenderness.  There is no rebound.  Genitourinary:    General: Normal vulva.     Exam position: Lithotomy position.     Pubic Area: No rash or pubic lice.      Labia:        Right: No rash or lesion.        Left: No rash or lesion.      Vagina: Normal. No vaginal discharge, erythema, bleeding or lesions.     Cervix: No cervical motion tenderness, discharge, friability, lesion or erythema.     Uterus: Normal.      Adnexa: Right adnexa normal and left adnexa normal.     Rectum: Normal.      Comments: pH = <4.5 Red area represents small bump without erythema, drainage. Lymphadenopathy:     Head:     Right side of head: No preauricular or posterior auricular adenopathy.     Left side of head: No preauricular or posterior auricular adenopathy.     Cervical: No cervical adenopathy.     Upper Body:     Right upper body: No supraclavicular, axillary or epitrochlear adenopathy.     Left upper body: No supraclavicular, axillary or epitrochlear adenopathy.     Lower Body: No right inguinal adenopathy. No left inguinal adenopathy.   Skin:    General: Skin is warm and dry.     Findings: No rash.   Neurological:     Mental Status: She is alert and oriented to person, place, and time.     Assessment and Plan:  Nancy Mcdowell is a 58 y.o. female presenting to the [facility] Department for STI screening  1. Screening for venereal disease (Primary) Lesion does not appear vesicular in nature, no signs of syphilis chancre or other concern for STI. Likely ingrown hair or folliculitis  - Chlamydia/Gonorrhea [place] [facility] - HIV [place] [facility] - Syphilis Serology, [facility] - WET PREP FOR TRICH, YEAST, CLUE - Gonococcus culture - Virology, [place] [facility] - Gonococcus culture  2. Postmenopausal vaginal bleeding - Counseled to seek evaluation from ob/gyn  Patient accepted the following screenings: anal GC culture, oral GC culture, vaginal CT/GC swab, vaginal wet prep, HIV, and RPR Patient  meets criteria for HepB screening? No. Ordered? not applicable Patient meets criteria for HepC screening? No. Ordered? not applicable  Treat wet prep per standing order Discussed time line for [facility] results and that patient will be called with positive results and encouraged patient to call if she had not heard in 2 weeks.  Counseled to return or seek care for continued or worsening symptoms Recommended repeat testing in 3 months with positive results. Recommended condom use with all sex for STI prevention.   Patient is currently using post menopausal to prevent pregnancy.    No follow-ups on file.  Future Appointments  Date Time Provider Department Center  05/20/2024  9:00 AM Valerio Melanie DASEN, NP [facility]    [place], [place]

## 2023-11-08 LAB — VIROLOGY, ~~LOC~~ LAB
HSV 1 DNA: NEGATIVE
HSV 2 , PCR: NEGATIVE
Varicella-Zoster, PCR: NEGATIVE

## 2023-11-11 LAB — GONOCOCCUS CULTURE

## 2023-11-12 ENCOUNTER — Ambulatory Visit: Payer: Self-pay | Admitting: Family Medicine

## 2023-11-18 ENCOUNTER — Encounter: Payer: Self-pay | Admitting: Family Medicine

## 2024-03-18 DIAGNOSIS — L6612 Frontal fibrosing alopecia: Secondary | ICD-10-CM | POA: Diagnosis not present

## 2024-04-23 IMAGING — MG DIGITAL DIAGNOSTIC BILAT W/ TOMO W/ CAD
6 of 12 series · 6 of 36 positions shown · non-contrast
Comparison: Previous exam(s).

CLINICAL DATA: 56-year-old female presenting with new
lumps/hardness in the inferior bilateral breasts.



[R MLO synth-2D]
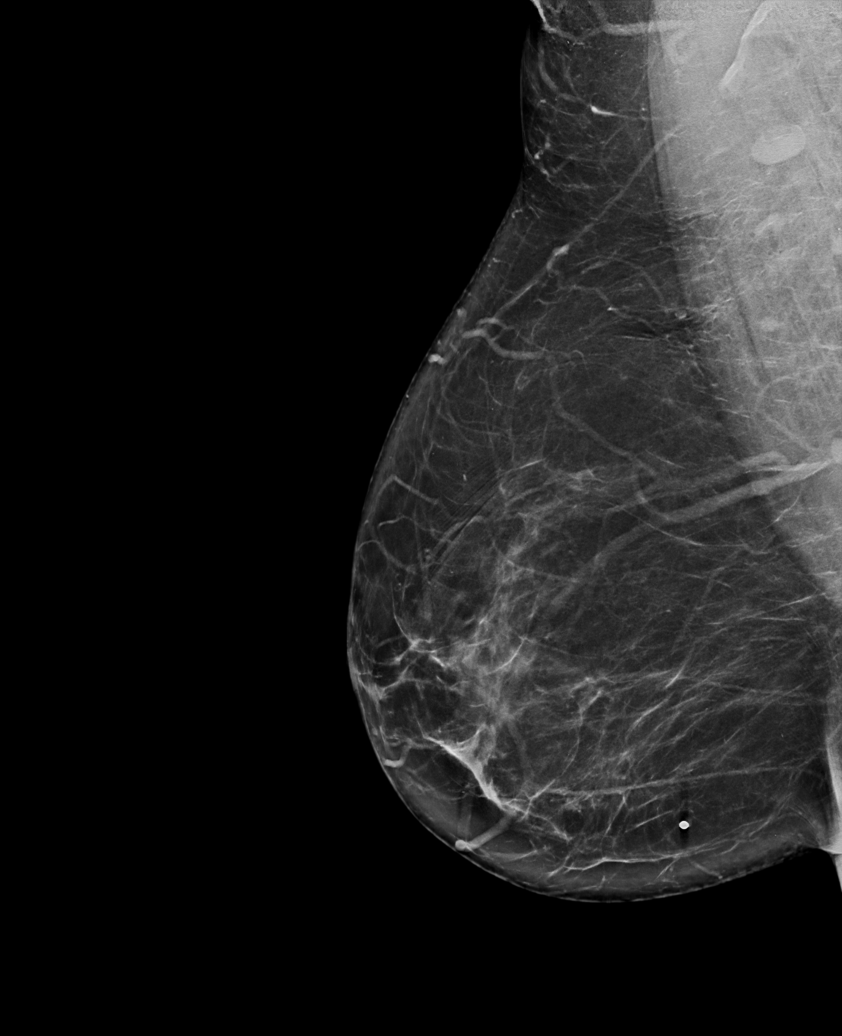

[R CC synth-2D]
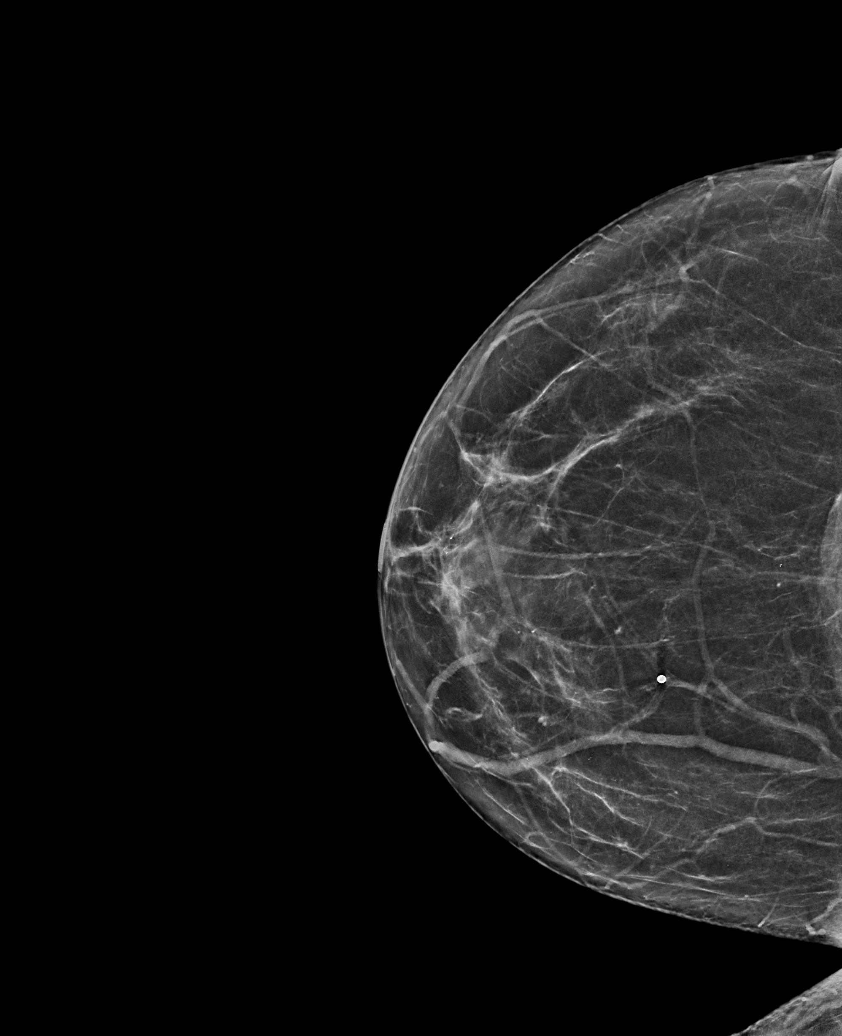

[L MLO synth-2D]
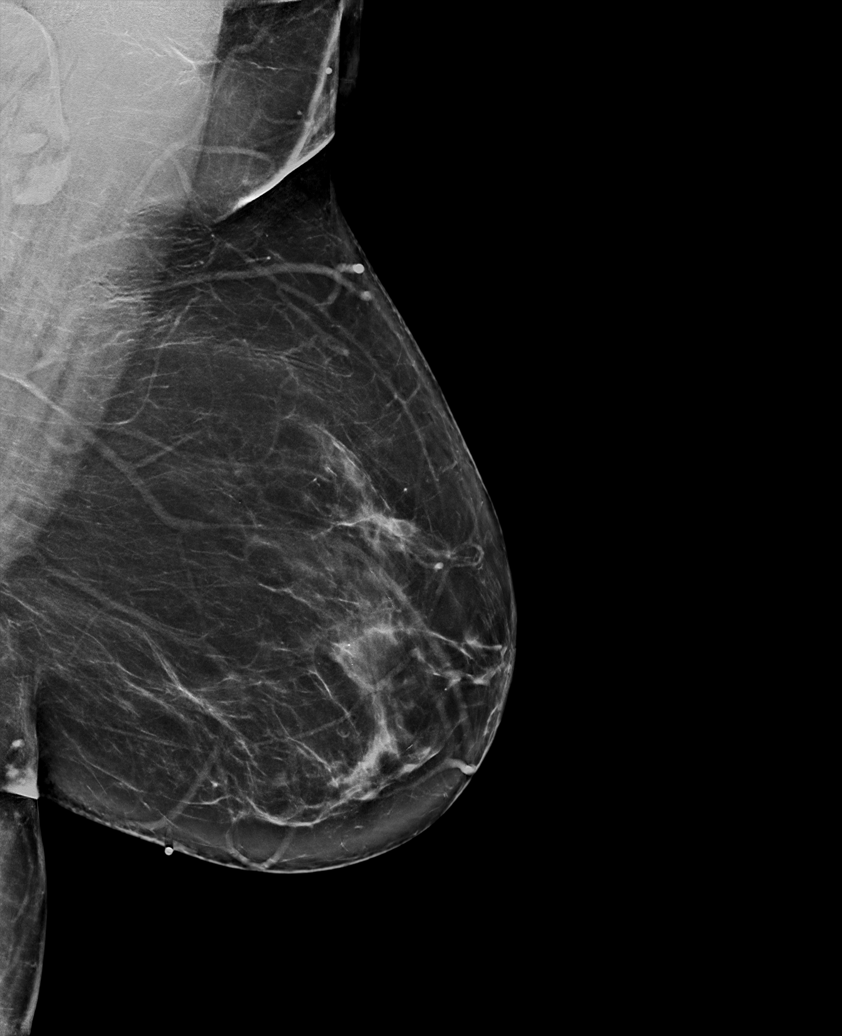

[L CC synth-2D]
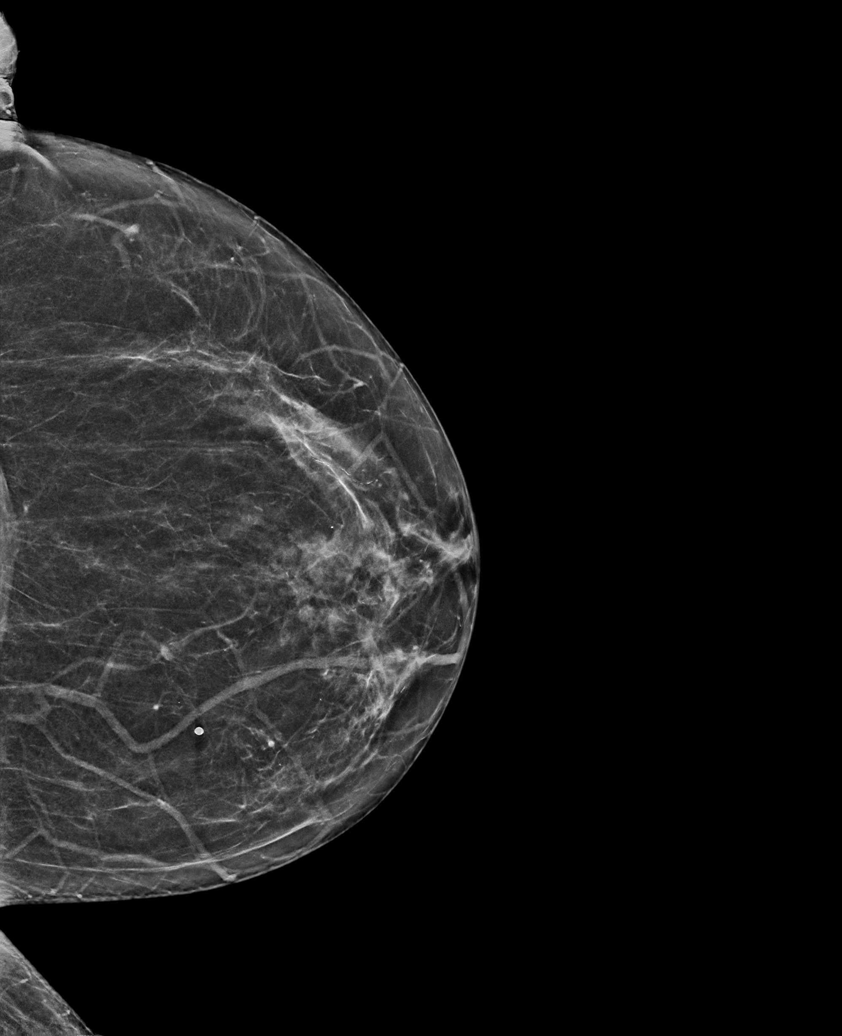

[R TAN synth-2D]
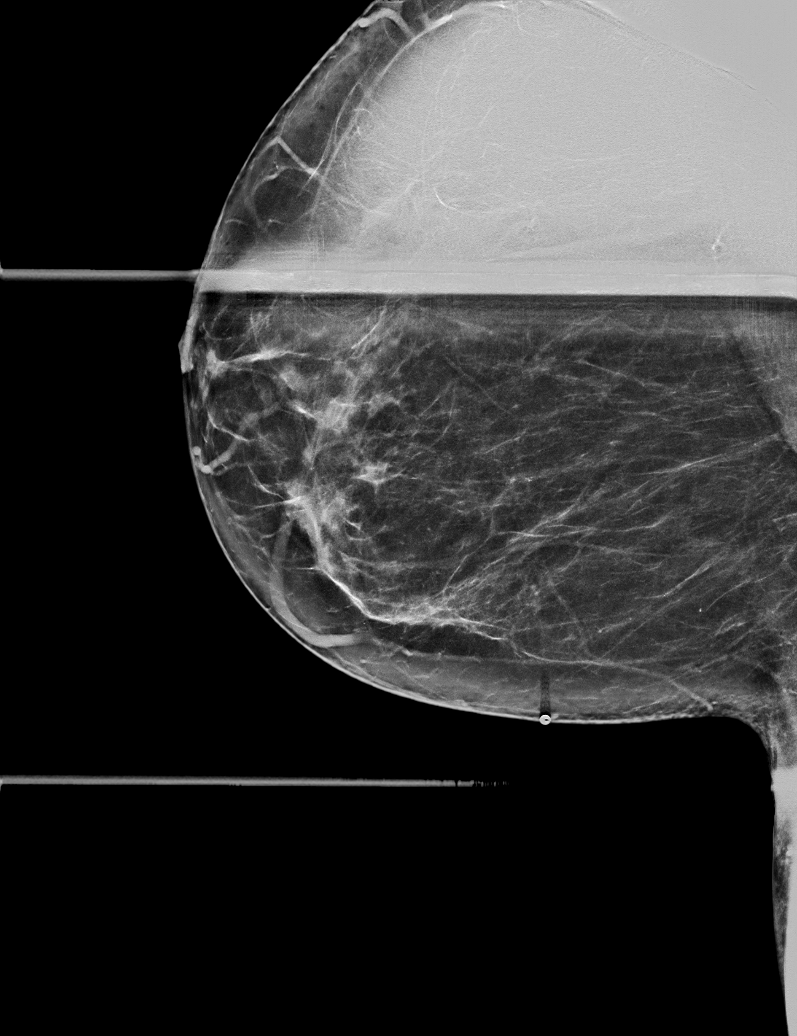

[L TAN synth-2D]
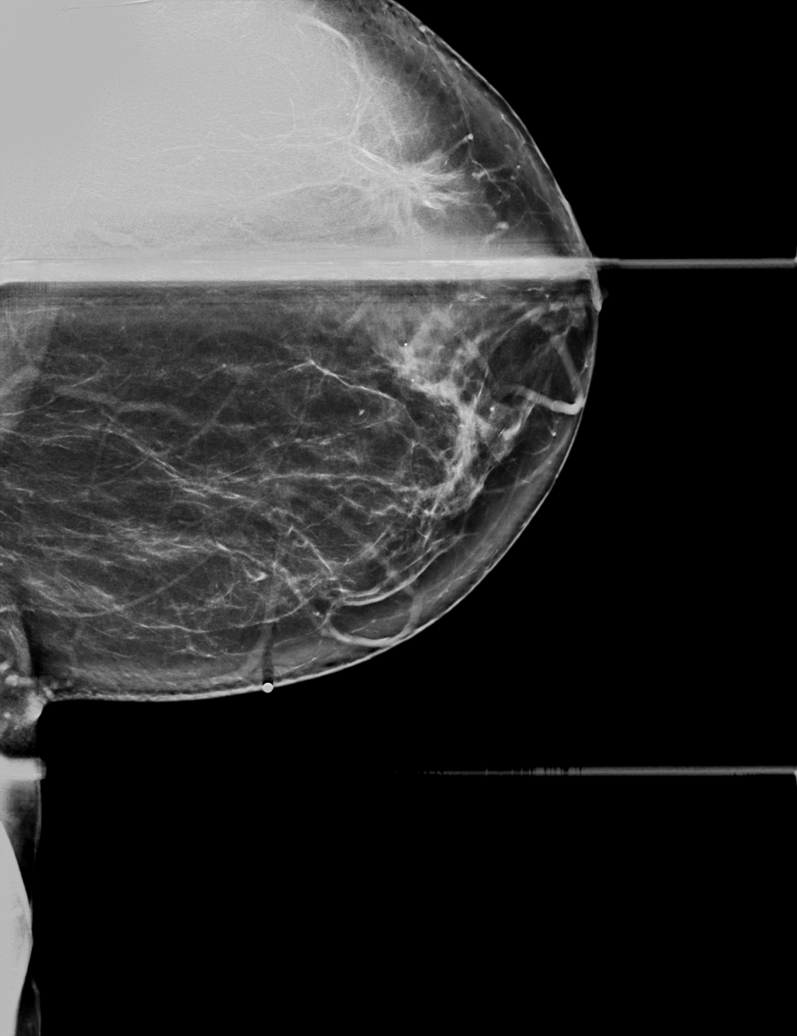

[6 of 36 positions shown; findings below may reference images not displayed]

ACR Breast Density Category b: There are scattered areas of
fibroglandular density.
FINDINGS: Mammogram:

Right breast: A skin BB marks the palpable site of concern reported
by the patient in the lower inner right breast. A spot tangential
view of this area was performed in addition to standard views. There
is no new abnormality at the palpable site or elsewhere in the right
breast to suggest the presence of malignancy.

Left breast: A skin BB marks the palpable site of concern reported
by the patient in the lower inner left breast. A spot tangential
view of this area was performed in addition to standard views. There
is no new abnormality at the palpable site or elsewhere in the left
breast to suggest the presence of malignancy.

At the sites of concern reported by the patient in the bilateral
lower inner breast I do not feel a discrete mass or focal area of
thickening.

Ultrasound:

Targeted ultrasound is performed at the bilateral palpable areas of
concern in the left breast at 8 o'clock and in the right breast at 4
o'clock demonstrating no cystic or solid mass. There is only normal
fibroglandular tissue.
IMPRESSION: No mammographic or sonographic evidence of malignancy at the
bilateral palpable sites of concern.

RECOMMENDATION:
1. Recommend any further workup of the palpable sites be on a
clinical basis.

2.  Screening mammogram in one year.(Code:D6-M-7HO)

I have discussed the findings and recommendations with the patient.
If applicable, a reminder letter will be sent to the patient
regarding the next appointment.

BI-RADS CATEGORY  1: Negative.

## 2024-05-16 NOTE — Patient Instructions (Incomplete)
 Talk to insurance about Cologuard or Colonoscopy  Please call to schedule your mammogram and/or bone density: [facility] at [facility]  Address: [address], [place], [place] [address] Phone: [phone]  [facility] [address] [place],  [place]  [address] Phone: [phone]    Healthy Eating, Adult Healthy eating may help you get and keep a healthy body weight, reduce the risk of chronic disease, and live a long and productive life. It is important to follow a healthy eating pattern. Your nutritional and calorie needs should be met mainly by different nutrient-rich foods. What are tips for following this plan? Reading food labels Read labels and choose the following: Reduced or low sodium products. Juices with 100% fruit juice. Foods with low saturated fats (<3 g per serving) and high polyunsaturated and monounsaturated fats. Foods with whole grains, such as whole wheat, cracked wheat, brown rice, and wild rice. Whole grains that are fortified with folic acid . This is recommended for females who are pregnant or who want to become pregnant. Read labels and do not eat or drink the following: Foods or drinks with added sugars. These include foods that contain brown sugar, corn sweetener, corn syrup, dextrose , fructose, glucose, high-fructose corn syrup, honey, invert sugar, lactose, malt syrup, maltose, molasses, raw sugar, sucrose, trehalose, or turbinado sugar. Limit your intake of added sugars to less than 10% of your total daily calories. Do not eat more than the following amounts of added sugar per day: 6 teaspoons (25 g) for females. 9 teaspoons (38 g) for males. Foods that contain processed or refined starches and grains. Refined grain products, such as white flour, degermed cornmeal, white bread, and white rice. Shopping Choose nutrient-rich snacks, such as vegetables, whole fruits, and nuts. Avoid  high-calorie and high-sugar snacks, such as potato chips, fruit snacks, and candy. Use oil-based dressings and spreads on foods instead of solid fats such as butter, margarine, sour cream, or cream cheese. Limit pre-made sauces, mixes, and instant products such as flavored rice, instant noodles, and ready-made pasta. Try more plant-protein sources, such as tofu, tempeh, black beans, edamame, lentils, nuts, and seeds. Explore eating plans such as the Mediterranean diet or vegetarian diet. Try heart-healthy dips made with beans and healthy fats like hummus and guacamole. Vegetables go great with these. Cooking Use oil to saut or stir-fry foods instead of solid fats such as butter, margarine, or lard. Try baking, boiling, grilling, or broiling instead of frying. Remove the fatty part of meats before cooking. Steam vegetables in water or broth. Meal planning  At meals, imagine dividing your plate into fourths: One-half of your plate is fruits and vegetables. One-fourth of your plate is whole grains. One-fourth of your plate is protein, especially lean meats, poultry, eggs, tofu, beans, or nuts. Include low-fat dairy as part of your daily diet. Lifestyle Choose healthy options in all settings, including home, work, school, restaurants, or stores. Prepare your food safely: Wash your hands after handling raw meats. Where you prepare food, keep surfaces clean by regularly washing with hot, soapy water. Keep raw meats separate from ready-to-eat foods, such as fruits and vegetables. Cook seafood, meat, poultry, and eggs to the recommended temperature. Get a food thermometer. Store foods at safe temperatures. In general: Keep cold foods at 23F (4.4C) or below. Keep hot foods at 123F (60C) or above. Keep your freezer at [facility] (-17.8C) or below. Foods are not safe to eat if they have been  between the temperatures of 40-140F (4.4-60C) for more than 2 hours. What foods should I  eat? Fruits Aim to eat 1-2 cups of fresh, canned (in natural juice), or frozen fruits each day. One cup of fruit equals 1 small apple, 1 large banana, 8 large strawberries, 1 cup (237 g) canned fruit,  cup (82 g) dried fruit, or 1 cup (240 mL) 100% juice. Vegetables Aim to eat 2-4 cups of fresh and frozen vegetables each day, including different varieties and colors. One cup of vegetables equals 1 cup (91 g) broccoli or cauliflower florets, 2 medium carrots, 2 cups (150 g) raw, leafy greens, 1 large tomato, 1 large bell pepper, 1 large sweet potato, or 1 medium white potato. Grains Aim to eat 5-10 ounce-equivalents of whole grains each day. Examples of 1 ounce-equivalent of grains include 1 slice of bread, 1 cup (40 g) ready-to-eat cereal, 3 cups (24 g) popcorn, or  cup (93 g) cooked rice. Meats and other proteins Try to eat 5-7 ounce-equivalents of protein each day. Examples of 1 ounce-equivalent of protein include 1 egg,  oz nuts (12 almonds, 24 pistachios, or 7 walnut halves), 1/4 cup (90 g) cooked beans, 6 tablespoons (90 g) hummus or 1 tablespoon (16 g) peanut butter. A cut of meat or fish that is the size of a deck of cards is about 3-4 ounce-equivalents (85 g). Of the protein you eat each week, try to have at least 8 sounce (227 g) of seafood. This is about 2 servings per week. This includes salmon, trout, herring, sardines, and anchovies. Dairy Aim to eat 3 cup-equivalents of fat-free or low-fat dairy each day. Examples of 1 cup-equivalent of dairy include 1 cup (240 mL) milk, 8 ounces (250 g) yogurt, 1 ounces (44 g) natural cheese, or 1 cup (240 mL) fortified soy milk. Fats and oils Aim for about 5 teaspoons (21 g) of fats and oils per day. Choose monounsaturated fats, such as canola and olive oils, mayonnaise made with olive oil or avocado oil, avocados, peanut butter, and most nuts, or polyunsaturated fats, such as sunflower, corn, and soybean oils, walnuts, pine nuts, sesame seeds,  sunflower seeds, and flaxseed. Beverages Aim for 6 eight-ounce glasses of water per day. Limit coffee to 3-5 eight-ounce cups per day. Limit caffeinated beverages that have added calories, such as soda and energy drinks. If you drink alcohol: Limit how much you have to: 0-1 drink a day if you are female. 0-2 drinks a day if you are female. Know how much alcohol is in your drink. In the [place] one drink is one 12 oz bottle of beer (355 mL), one 5 oz glass of wine (148 mL), or one 1 oz glass of hard liquor (44 mL). Seasoning and other foods Try not to add too much salt to your food. Try using herbs and spices instead of salt. Try not to add sugar to food. This information is based on [place] nutrition guidelines. To learn more, visit disposablenylon.be. Exact amounts may vary. You may need different amounts. This information is not intended to replace advice given to you by your health care provider. Make sure you discuss any questions you have with your health care provider. Document Revised: 01/29/2022 Document Reviewed: 01/29/2022 Elsevier Patient Education  2024 [employer].

## 2024-05-20 ENCOUNTER — Other Ambulatory Visit (HOSPITAL_COMMUNITY)
Admission: RE | Admit: 2024-05-20 | Discharge: 2024-05-20 | Disposition: A | Discharge status: Home / Self Care | Source: Ambulatory Visit | Attending: Nurse Practitioner | Admitting: Nurse Practitioner

## 2024-05-20 ENCOUNTER — Encounter: Payer: Self-pay | Admitting: Nurse Practitioner

## 2024-05-20 ENCOUNTER — Ambulatory Visit (INDEPENDENT_AMBULATORY_CARE_PROVIDER_SITE_OTHER): Payer: Self-pay | Admitting: Nurse Practitioner

## 2024-05-20 VITALS — BP 125/81 | HR 63 | Temp 98.0°F | Resp 17 | Ht 64.0 in | Wt 196.8 lb

## 2024-05-20 DIAGNOSIS — Z1231 Encounter for screening mammogram for malignant neoplasm of breast: Secondary | ICD-10-CM | POA: Diagnosis not present

## 2024-05-20 DIAGNOSIS — Z6835 Body mass index (BMI) 35.0-35.9, adult: Secondary | ICD-10-CM

## 2024-05-20 DIAGNOSIS — E6609 Other obesity due to excess calories: Secondary | ICD-10-CM | POA: Diagnosis not present

## 2024-05-20 DIAGNOSIS — K219 Gastro-esophageal reflux disease without esophagitis: Secondary | ICD-10-CM

## 2024-05-20 DIAGNOSIS — L83 Acanthosis nigricans: Secondary | ICD-10-CM | POA: Diagnosis not present

## 2024-05-20 DIAGNOSIS — E66812 Obesity, class 2: Secondary | ICD-10-CM | POA: Diagnosis not present

## 2024-05-20 DIAGNOSIS — E559 Vitamin D deficiency, unspecified: Secondary | ICD-10-CM

## 2024-05-20 DIAGNOSIS — E7800 Pure hypercholesterolemia, unspecified: Secondary | ICD-10-CM | POA: Diagnosis not present

## 2024-05-20 DIAGNOSIS — F1010 Alcohol abuse, uncomplicated: Secondary | ICD-10-CM

## 2024-05-20 DIAGNOSIS — R87810 Cervical high risk human papillomavirus (HPV) DNA test positive: Secondary | ICD-10-CM | POA: Diagnosis present

## 2024-05-20 DIAGNOSIS — Z0000 Encounter for general adult medical examination without abnormal findings: Secondary | ICD-10-CM

## 2024-05-20 DIAGNOSIS — G629 Polyneuropathy, unspecified: Secondary | ICD-10-CM

## 2024-05-20 NOTE — Assessment & Plan Note (Signed)
BMI 33.78.  Recommended eating smaller high protein, low fat meals more frequently and exercising 30 mins a day 5 times a week with a goal of 10-15lb weight loss in the next 3 months. Patient voiced their understanding and motivation to adhere to these recommendations.

## 2024-05-20 NOTE — Assessment & Plan Note (Addendum)
 Check A1c, TSH, and CMP today.

## 2024-05-20 NOTE — Progress Notes (Signed)
 "  BP 125/81 (BP Location: Left Arm, Patient Position: Sitting, Cuff Size: Normal)   Pulse 63   Temp 98 F (36.7 C) (Oral)   Resp 17   Ht 5' 4 (1.626 m)   Wt 196 lb 12.8 oz (89.3 kg)   LMP  (LMP Unknown) Comment: 3 years ago  SpO2 98%   BMI 33.78 kg/m    Subjective:    Patient ID: Nancy Mcdowell, female    DOB: 1966/05/05, 59 y.o.   MRN: [id number]  HPI: Nancy Mcdowell is a 59 y.o. female presenting on 05/20/2024 for comprehensive medical examination. Current medical complaints include:none - no acute concerns today  She currently lives with: family Menopausal Symptoms: no  Has history of abnormal pap and had biopsy one year ago, to have repeat pap today. Biopsy did show low grade cellular abnormality.  The 10-year ASCVD risk score (Arnett DK, et al., 2019) is: 3.6%   Values used to calculate the score:     Age: 52 years     Clinically relevant sex: Female     Is Non-Hispanic African American: Yes     Diabetic: No     Tobacco smoker: No     Systolic Blood Pressure: 125 mmHg     Is BP treated: No     HDL Cholesterol: 48 mg/dL     Total Cholesterol: 194 mg/dL  GERD Takes OTC medication which works well. GERD control status: stable Satisfied with current treatment? yes Heartburn frequency: occasional Medication side effects: no  Medication compliance: stable Previous GERD medications: baking soda and water Dysphagia: no Odynophagia:  no Hematemesis: no Blood in stool: no EGD: no   Functional Status Survey: Is the patient deaf or have difficulty hearing?: No Does the patient have difficulty seeing, even when wearing glasses/contacts?: No Does the patient have difficulty concentrating, remembering, or making decisions?: No Does the patient have difficulty walking or climbing stairs?: No Does the patient have difficulty dressing or bathing?: No Does the patient have difficulty doing errands alone such as visiting a doctor's office or shopping?: No   Depression  Screen done today and results listed below:     05/20/2024    9:12 AM 05/16/2023    8:22 AM 02/07/2023    8:53 AM 01/24/2023    9:13 AM 05/04/2022    8:29 AM  Depression screen PHQ 2/9  Decreased Interest 0 0 0 0 1  Down, Depressed, Hopeless 0 0 0 0 1  PHQ - 2 Score 0 0 0 0 2  Altered sleeping 0 0 0 1 1  Tired, decreased energy 0 0 0 0 0  Change in appetite 0 0 0 0 0  Feeling bad or failure about yourself  0 0 0 0 0  Trouble concentrating 0 0 0 0 0  Moving slowly or fidgety/restless 0 0 0 1 0  Suicidal thoughts 0 0 0 0 0  PHQ-9 Score 0 0  0  2  3   Difficult doing work/chores Not difficult at all Not difficult at all Not difficult at all  Not difficult at all     Data saved with a previous flowsheet row definition      05/20/2024    9:12 AM 05/16/2023    8:25 AM 02/07/2023    8:53 AM 01/24/2023    9:12 AM  GAD 7 : Generalized Anxiety Score  Nervous, Anxious, on Edge 0 0 0 0  Control/stop worrying 0 0 0 0  Worry too  much - different things 0 0 0 0  Trouble relaxing 0 0 0 0  Restless 0 0 0 0  Easily annoyed or irritable 0 0 0 0  Afraid - awful might happen 0 0 0 0  Total GAD 7 Score 0 0 0 0  Anxiety Difficulty Not difficult at all Not difficult at all Not difficult at all Not difficult at all      09/29/2021    9:48 AM 05/04/2022    8:29 AM 02/07/2023    8:51 AM 05/16/2023    8:22 AM 05/20/2024    9:12 AM  Fall Risk  Falls in the past year? 0 0 0 0 0  Was there an injury with Fall? 0  0  0  0  0  Fall Risk Category Calculator 0 0 0 0 0  Fall Risk Category (Retired) Low  Low      (RETIRED) Patient Fall Risk Level Low fall risk       Patient at Risk for Falls Due to No Fall Risks No Fall Risks No Fall Risks No Fall Risks No Fall Risks  Fall risk Follow up Falls evaluation completed  Falls evaluation completed  Falls evaluation completed Falls evaluation completed Falls evaluation completed     Data saved with a previous flowsheet row definition    Past Medical History:  Past  Medical History:  Diagnosis Date   Alcohol abuse (1 bottle wine qoday) 02/15/2020   Anemia 12/25/2016   Cataract    COVID-19 03/2021   dx week of thanksgiving   GERD (gastroesophageal reflux disease)    Heart murmur    Said she was told she had one at 20   Sleep apnea    mild-was not given cpap    Surgical History:  Past Surgical History:  Procedure Laterality Date   COLONOSCOPY WITH PROPOFOL  N/A 02/04/2017   Procedure: COLONOSCOPY WITH PROPOFOL ;  Surgeon: Therisa Bi, MD;  Location: [facility] ENDOSCOPY;  Service: Gastroenterology;  Laterality: N/A;   EYE SURGERY Left 2010   retinal detachment   LIPOMA EXCISION Bilateral 04/25/2021   Procedure: EXCISION LIPOMA, bilateral axillary lipomas;  Surgeon: Desiderio Schanz, MD;  Location: [facility] ORS;  Service: General;  Laterality: Bilateral;  Provider requesting 2.5 hours / 150 minutes for procedure.   RETINAL DETACHMENT SURGERY Left 2010, 2011   2    Medications:  Current Outpatient Medications on File Prior to Visit  Medication Sig   acetaminophen  (TYLENOL ) 500 MG tablet Take 2 tablets (1,000 mg total) by mouth every 6 (six) hours as needed for mild pain.   ibuprofen  (ADVIL ) 600 MG tablet Take 1 tablet (600 mg total) by mouth every 8 (eight) hours as needed for moderate pain.   ketoconazole (NIZORAL) 2 % shampoo Apply 1 Application topically 3 (three) times a week.   lidocaine  (LIDODERM ) 5 % Place 1 patch onto the skin daily. Remove & Discard patch within 12 hours or as directed by MD   minoxidil  (LONITEN ) 2.5 MG tablet Take 1.25 mg by mouth 2 (two) times daily.   MINOXIDIL , TOPICAL, 5 % SOLN Apply 1 application  topically at bedtime. Massage minoxidil  onto the scalp with fingers.  Hands should be washed after application. Apply at least two hours before bed to allow adequate time for drying.   pantoprazole (PROTONIX) 40 MG tablet Take 40 mg by mouth daily as needed (Acid reflux).   spironolactone (ALDACTONE) 100 MG tablet Take 200 mg by mouth  daily.   No current facility-administered  medications on file prior to visit.    Allergies:  No Known Allergies  Social History:  Social History   Socioeconomic History   Marital status: Legally Separated    Spouse name: Not on file   Number of children: Not on file   Years of education: Not on file   Highest education level: Not on file  Occupational History   Not on file  Tobacco Use   Smoking status: Never   Smokeless tobacco: Never  Vaping Use   Vaping status: Never Used  Substance and Sexual Activity   Alcohol use: Yes    Alcohol/week: 1.0 standard drink of alcohol    Types: 1 Glasses of wine per week    Comment: glass of wine once or twice a week   Drug use: Not Currently    Types: Marijuana    Comment: last used in teens   Sexual activity: Yes    Partners: Male    Birth control/protection: Post-menopausal, Condom  Other Topics Concern   Not on file  Social History Narrative   Not on file   Social Drivers of Health   Tobacco Use: Low Risk (05/20/2024)   Patient History    Smoking Tobacco Use: Never    Smokeless Tobacco Use: Never    Passive Exposure: Not on file  Financial Resource Strain: Not on file  Food Insecurity: Not on file  Transportation Needs: Not on file  Physical Activity: Not on file  Stress: Not on file  Social Connections: Not on file  Intimate Partner Violence: Not At Risk (12/06/2021)   Humiliation, Afraid, Rape, and Kick questionnaire    Fear of Current or Ex-Partner: No    Emotionally Abused: No    Physically Abused: No    Sexually Abused: No  Depression (PHQ2-9): Low Risk (05/20/2024)   Depression (PHQ2-9)    PHQ-2 Score: 0  Alcohol Screen: Not on file  Housing: Not on file  Utilities: Not on file  Health Literacy: Not on file   Social History   Tobacco Use  Smoking Status Never  Smokeless Tobacco Never   Social History   Substance and Sexual Activity  Alcohol Use Yes   Alcohol/week: 1.0 standard drink of alcohol    Types: 1 Glasses of wine per week   Comment: glass of wine once or twice a week    Family History:  Family History  Problem Relation Age of Onset   Hyperlipidemia Mother    Hypertension Mother    Stroke Mother    Arthritis Mother    Kidney disease Mother    Thyroid disease Mother    Kidney disease Father    Heart attack Father    Diabetes Father    Cancer Maternal Aunt        Breast Cancer   Breast cancer Maternal Aunt 50   Breast cancer Maternal Aunt 60   Parkinson's disease Maternal Grandmother    Heart attack Paternal Grandfather    Breast cancer Cousin    Past medical history, surgical history, medications, allergies, family history and social history reviewed with patient today and changes made to appropriate areas of the chart.   ROS All other ROS negative except what is listed above and in the HPI.      Objective:    BP 125/81 (BP Location: Left Arm, Patient Position: Sitting, Cuff Size: Normal)   Pulse 63   Temp 98 F (36.7 C) (Oral)   Resp 17   Ht 5' 4 (1.626 m)  Wt 196 lb 12.8 oz (89.3 kg)   LMP  (LMP Unknown) Comment: 3 years ago  SpO2 98%   BMI 33.78 kg/m   Wt Readings from Last 3 Encounters:  05/20/24 196 lb 12.8 oz (89.3 kg)  05/31/23 188 lb 6.4 oz (85.5 kg)  05/16/23 189 lb 12.8 oz (86.1 kg)    Physical Exam Vitals and nursing note reviewed. Exam conducted with a chaperone present.  Constitutional:      General: She is awake. She is not in acute distress.    Appearance: She is well-developed and well-groomed. She is obese. She is not ill-appearing or toxic-appearing.  HENT:     Head: Normocephalic and atraumatic.     Right Ear: Hearing, tympanic membrane, ear canal and external ear normal. No drainage.     Left Ear: Hearing, tympanic membrane, ear canal and external ear normal. No drainage.     Nose: Nose normal.     Right Sinus: No maxillary sinus tenderness or frontal sinus tenderness.     Left Sinus: No maxillary sinus tenderness or  frontal sinus tenderness.     Mouth/Throat:     Mouth: Mucous membranes are moist.     Pharynx: Oropharynx is clear. Uvula midline. No pharyngeal swelling, oropharyngeal exudate or posterior oropharyngeal erythema.  Eyes:     General: Lids are normal.        Right eye: No discharge.        Left eye: No discharge.     Extraocular Movements: Extraocular movements intact.     Conjunctiva/sclera: Conjunctivae normal.     Pupils: Pupils are equal, round, and reactive to light.     Visual Fields: Right eye visual fields normal and left eye visual fields normal.  Neck:     Thyroid: No thyromegaly.     Vascular: No carotid bruit.     Trachea: Trachea normal.     Comments: Acanthosis nigricans noted. Cardiovascular:     Rate and Rhythm: Normal rate and regular rhythm.     Heart sounds: Normal heart sounds. No murmur heard.    No gallop.  Pulmonary:     Effort: Pulmonary effort is normal. No accessory muscle usage or respiratory distress.     Breath sounds: Normal breath sounds.  Chest:  Breasts:    Right: Normal.     Left: Normal.  Abdominal:     General: Bowel sounds are normal.     Palpations: Abdomen is soft. There is no hepatomegaly or splenomegaly.     Tenderness: There is no abdominal tenderness.     Hernia: There is no hernia in the left inguinal area or right inguinal area.  Genitourinary:    Exam position: Lithotomy position.     Labia:        Right: No rash.        Left: No rash.      Urethra: No prolapse.     Vagina: Erythema (mild atrophy) present.     Cervix: Friability present.     Uterus: Normal.      Adnexa: Right adnexa normal and left adnexa normal.     Comments: Cervix anterior and midline, pap obtained.  Slightly friable with scant bleeding during pap.  Discussed with patient. Musculoskeletal:        General: Normal range of motion.     Cervical back: Normal range of motion and neck supple.     Right lower leg: No edema.     Left lower leg: No edema.  Lymphadenopathy:     Head:     Right side of head: No submental, submandibular, tonsillar, preauricular or posterior auricular adenopathy.     Left side of head: No submental, submandibular, tonsillar, preauricular or posterior auricular adenopathy.     Cervical: No cervical adenopathy.     Upper Body:     Right upper body: No supraclavicular, axillary or pectoral adenopathy.     Left upper body: No supraclavicular, axillary or pectoral adenopathy.  Skin:    General: Skin is warm and dry.     Capillary Refill: Capillary refill takes less than 2 seconds.     Findings: No rash.  Neurological:     Mental Status: She is alert and oriented to person, place, and time.     Gait: Gait is intact.     Deep Tendon Reflexes: Reflexes are normal and symmetric.     Reflex Scores:      Brachioradialis reflexes are 2+ on the right side and 2+ on the left side.      Patellar reflexes are 2+ on the right side and 2+ on the left side. Psychiatric:        Attention and Perception: Attention normal.        Mood and Affect: Mood normal.        Speech: Speech normal.        Behavior: Behavior normal. Behavior is cooperative.        Thought Content: Thought content normal.        Judgment: Judgment normal.    Results for orders placed or performed in visit on 11/18/23  [facility] SCREENING LAB   Collection Time: 11/06/23 12:00 AM  Result Value Ref Range   [facility] HIV Screening Negative - Validated       Assessment & Plan:   Problem List Items Addressed This Visit       Digestive   GERD (gastroesophageal reflux disease)   Chronic, stable with minimal use of medications, continue as needed only and focus on diet changes.  Mag level today.      Relevant Orders   Magnesium     Musculoskeletal and Integument   Acanthosis nigricans   Check A1c, TSH, and CMP today.       Relevant Orders   CBC with Differential/Platelet   TSH   HgB A1c     Other   Obesity   BMI 33.78.  Recommended eating smaller  high protein, low fat meals more frequently and exercising 30 mins a day 5 times a week with a goal of 10-15lb weight loss in the next 3 months. Patient voiced their understanding and motivation to adhere to these recommendations.       Elevated low density lipoprotein (LDL) cholesterol level - Primary   Noted on past labs.  Is focused on diet and exercise.  Continue this.  Labs today. The 10-year ASCVD risk score (Arnett DK, et al., 2019) is: 3.6%   Values used to calculate the score:     Age: 83 years     Clinically relevant sex: Female     Is Non-Hispanic African American: Yes     Diabetic: No     Tobacco smoker: No     Systolic Blood Pressure: 125 mmHg     Is BP treated: No     HDL Cholesterol: 48 mg/dL     Total Cholesterol: 194 mg/dL       Relevant Orders   Comprehensive metabolic panel with GFR  Lipid Panel w/o Chol/HDL Ratio   Cervical high risk HPV (human papillomavirus) test positive   Due for 1 year repeat pap, obtained today and will get back into GYN as needed.      Other Visit Diagnoses       Vitamin D deficiency       History of low levels, check today and supplement as needed.   Relevant Orders   VITAMIN D 25 Hydroxy (Vit-D Deficiency, Fractures)     Encounter for screening mammogram for malignant neoplasm of breast       Mammogram ordered and instructed how to schedule.   Relevant Orders   MM 3D SCREENING MAMMOGRAM BILATERAL BREAST     Encounter for annual physical exam       Annual physical today with labs and health maintenance reviewed, discussed with patient.        Follow up plan: Return in about 1 year (around 05/20/2025) for Annual Physical.   LABORATORY TESTING:  - Pap smear: pap done for one year repeat  IMMUNIZATIONS:   - Tdap: Tetanus vaccination status reviewed: last tetanus booster within 10 years. - Influenza: Refused - Pneumovax: Not applicable - Prevnar: Not applicable - COVID: Refused - HPV: Not applicable - Shingrix vaccine:  Refused  SCREENING: -Mammogram: Ordered today last was 10/20/21 -- she has scheduled for the 28th - Colonoscopy: will reach out to insurance about this  - Bone Density: Not applicable  -Hearing Test: Not applicable  -Spirometry: Not applicable   PATIENT COUNSELING:   Advised to take 1 mg of folate supplement per day if capable of pregnancy.   Sexuality: Discussed sexually transmitted diseases, partner selection, use of condoms, avoidance of unintended pregnancy  and contraceptive alternatives.   Advised to avoid cigarette smoking.  I discussed with the patient that most people either abstain from alcohol or drink within safe limits (<=14/week and <=4 drinks/occasion for males, <=7/weeks and <= 3 drinks/occasion for females) and that the risk for alcohol disorders and other health effects rises proportionally with the number of drinks per week and how often a drinker exceeds daily limits.  Discussed cessation/primary prevention of drug use and availability of treatment for abuse.   Diet: Encouraged to adjust caloric intake to maintain  or achieve ideal body weight, to reduce intake of dietary saturated fat and total fat, to limit sodium intake by avoiding high sodium foods and not adding table salt, and to maintain adequate dietary potassium and calcium preferably from fresh fruits, vegetables, and low-fat dairy products.    Stressed the importance of regular exercise  Injury prevention: Discussed safety belts, safety helmets, smoke detector, smoking near bedding or upholstery.   Dental health: Discussed importance of regular tooth brushing, flossing, and dental visits.    NEXT PREVENTATIVE PHYSICAL DUE IN 1 YEAR. Return in about 1 year (around 05/20/2025) for Annual Physical.           "

## 2024-05-20 NOTE — Assessment & Plan Note (Signed)
 Due for 1 year repeat pap, obtained today and will get back into GYN as needed.

## 2024-05-20 NOTE — Addendum Note (Signed)
 Addended by: Pepper Wyndham on: 05/20/2024 10:18 AM   Modules accepted: Orders

## 2024-05-20 NOTE — Assessment & Plan Note (Signed)
 Chronic, stable with minimal use of medications, continue as needed only and focus on diet changes.  Mag level today.

## 2024-05-20 NOTE — Assessment & Plan Note (Signed)
 Noted on past labs.  Is focused on diet and exercise.  Continue this.  Labs today. The 10-year ASCVD risk score (Arnett DK, et al., 2019) is: 3.6%   Values used to calculate the score:     Age: 59 years     Clinically relevant sex: Female     Is Non-Hispanic African American: Yes     Diabetic: No     Tobacco smoker: No     Systolic Blood Pressure: 125 mmHg     Is BP treated: No     HDL Cholesterol: 48 mg/dL     Total Cholesterol: 194 mg/dL

## 2024-05-21 ENCOUNTER — Ambulatory Visit: Payer: Self-pay | Admitting: Nurse Practitioner

## 2024-05-21 DIAGNOSIS — R7989 Other specified abnormal findings of blood chemistry: Secondary | ICD-10-CM

## 2024-05-21 LAB — CBC WITH DIFFERENTIAL/PLATELET
Basophils Absolute: 0.1 x10E3/uL (ref 0.0–0.2)
Basos: 1 %
EOS (ABSOLUTE): 0.1 x10E3/uL (ref 0.0–0.4)
Eos: 2 %
Hematocrit: 37.6 % (ref 34.0–46.6)
Hemoglobin: 11.7 g/dL (ref 11.1–15.9)
Immature Grans (Abs): 0 x10E3/uL (ref 0.0–0.1)
Immature Granulocytes: 0 %
Lymphocytes Absolute: 1.8 x10E3/uL (ref 0.7–3.1)
Lymphs: 35 %
MCH: 27.9 pg (ref 26.6–33.0)
MCHC: 31.1 g/dL — ABNORMAL LOW (ref 31.5–35.7)
MCV: 90 fL (ref 79–97)
Monocytes Absolute: 0.5 x10E3/uL (ref 0.1–0.9)
Monocytes: 9 %
Neutrophils Absolute: 2.7 x10E3/uL (ref 1.4–7.0)
Neutrophils: 53 %
Platelets: 207 x10E3/uL (ref 150–450)
RBC: 4.19 x10E6/uL (ref 3.77–5.28)
RDW: 12.1 % (ref 11.7–15.4)
WBC: 5.1 x10E3/uL (ref 3.4–10.8)

## 2024-05-21 LAB — COMPREHENSIVE METABOLIC PANEL WITH GFR
ALT: 105 IU/L — ABNORMAL HIGH (ref 0–32)
AST: 65 IU/L — ABNORMAL HIGH (ref 0–40)
Albumin: 4.2 g/dL (ref 3.8–4.9)
Alkaline Phosphatase: 99 IU/L (ref 49–135)
BUN/Creatinine Ratio: 10 (ref 9–23)
BUN: 8 mg/dL (ref 6–24)
Bilirubin Total: 0.3 mg/dL (ref 0.0–1.2)
CO2: 22 mmol/L (ref 20–29)
Calcium: 9.4 mg/dL (ref 8.7–10.2)
Chloride: 104 mmol/L (ref 96–106)
Creatinine, Ser: 0.82 mg/dL (ref 0.57–1.00)
Globulin, Total: 2.9 g/dL (ref 1.5–4.5)
Glucose: 97 mg/dL (ref 70–99)
Potassium: 4.2 mmol/L (ref 3.5–5.2)
Sodium: 138 mmol/L (ref 134–144)
Total Protein: 7.1 g/dL (ref 6.0–8.5)
eGFR: 84 mL/min/1.73

## 2024-05-21 LAB — LIPID PANEL W/O CHOL/HDL RATIO
Cholesterol, Total: 168 mg/dL (ref 100–199)
HDL: 44 mg/dL
LDL Chol Calc (NIH): 113 mg/dL — ABNORMAL HIGH (ref 0–99)
Triglycerides: 57 mg/dL (ref 0–149)
VLDL Cholesterol Cal: 11 mg/dL (ref 5–40)

## 2024-05-21 LAB — TSH: TSH: 1.83 u[IU]/mL (ref 0.450–4.500)

## 2024-05-21 LAB — HEMOGLOBIN A1C
Est. average glucose Bld gHb Est-mCnc: 100 mg/dL
Hgb A1c MFr Bld: 5.1 % (ref 4.8–5.6)

## 2024-05-21 LAB — VITAMIN D 25 HYDROXY (VIT D DEFICIENCY, FRACTURES): Vit D, 25-Hydroxy: 36.3 ng/mL (ref 30.0–100.0)

## 2024-05-21 LAB — MAGNESIUM: Magnesium: 2 mg/dL (ref 1.6–2.3)

## 2024-05-21 NOTE — Progress Notes (Signed)
 Contacted via MyChart -- need lab only visit in 4 weeks  Good afternoon Nancy Mcdowell, your labs have returned: - CBC overall remains stable. - Kidney function, creatinine and eGFR, remains normal, however your liver function has trended up a lot from baseline normal levels. Have you been drinking alcohol more often lately or taking Tylenol ? If so cut back on these and I would like to recheck outpatient in 4 weeks. - Lipid panel continues to show some elevation in LDL, but this has trended down since last visit. Great job. Continue diet and exercise focus. - Remainder of labs are stable. Any questions? Keep being awesome!!  Thank you for allowing me to participate in your care.  I appreciate you. Kindest regards, Reyden Smith

## 2024-05-25 LAB — CYTOLOGY - PAP
Chlamydia: NEGATIVE
Comment: NEGATIVE
Comment: NEGATIVE
Comment: NEGATIVE
Comment: NORMAL
Diagnosis: NEGATIVE
High risk HPV: NEGATIVE
Neisseria Gonorrhea: NEGATIVE
Trichomonas: NEGATIVE

## 2024-05-25 MED ORDER — METRONIDAZOLE 500 MG PO TABS: 500.0000 mg | ORAL_TABLET | Freq: Two times a day (BID) | ORAL | 0 refills | Status: AC

## 2024-05-25 NOTE — Progress Notes (Signed)
 Contacted via MyChart  Good evening, pap returned negative on cytology and negative HPV. Great news!! It does show some bacterial vaginosis, this is not sexually transmitted but we do treat with Flagyl  twice a day for 7 days. Take this with probiotic and no alcohol use while taking.

## 2024-06-10 ENCOUNTER — Ambulatory Visit
Admission: RE | Admit: 2024-06-10 | Discharge: 2024-06-10 | Disposition: A | Discharge status: Home / Self Care | Source: Ambulatory Visit | Attending: Nurse Practitioner | Admitting: Nurse Practitioner

## 2024-06-10 DIAGNOSIS — Z1231 Encounter for screening mammogram for malignant neoplasm of breast: Secondary | ICD-10-CM | POA: Diagnosis present

## 2024-06-24 ENCOUNTER — Other Ambulatory Visit

## 2025-05-24 ENCOUNTER — Encounter: Admitting: Nurse Practitioner
